# Patient Record
Sex: Male | Born: 1945 | Race: Asian | Hispanic: No | Marital: Married | Smoking: Former smoker
Health system: Southern US, Community
[De-identification: ages and names within clinical notes are randomized; demographics above are authoritative.]

## PROBLEM LIST (undated history)

## (undated) DIAGNOSIS — I10 Essential (primary) hypertension: Secondary | ICD-10-CM

## (undated) DIAGNOSIS — E039 Hypothyroidism, unspecified: Secondary | ICD-10-CM

## (undated) DIAGNOSIS — E7800 Pure hypercholesterolemia, unspecified: Secondary | ICD-10-CM

## (undated) DIAGNOSIS — I517 Cardiomegaly: Secondary | ICD-10-CM

## (undated) DIAGNOSIS — N400 Enlarged prostate without lower urinary tract symptoms: Secondary | ICD-10-CM

## (undated) HISTORY — DX: Essential (primary) hypertension: I10

## (undated) HISTORY — DX: Cardiomegaly: I51.7

---

## 2009-05-07 ENCOUNTER — Encounter: Payer: Self-pay | Admitting: *Deleted

## 2012-02-05 ENCOUNTER — Other Ambulatory Visit: Payer: Self-pay | Admitting: Specialist

## 2012-02-05 ENCOUNTER — Ambulatory Visit
Admission: RE | Admit: 2012-02-05 | Discharge: 2012-02-05 | Disposition: A | Discharge status: Home / Self Care | Payer: Medicare Other | Source: Ambulatory Visit | Attending: Specialist | Admitting: Specialist

## 2012-02-05 DIAGNOSIS — R062 Wheezing: Secondary | ICD-10-CM

## 2012-02-05 DIAGNOSIS — R05 Cough: Secondary | ICD-10-CM

## 2012-02-05 DIAGNOSIS — R059 Cough, unspecified: Secondary | ICD-10-CM

## 2012-03-02 ENCOUNTER — Encounter: Payer: Self-pay | Admitting: *Deleted

## 2012-03-02 DIAGNOSIS — I10 Essential (primary) hypertension: Secondary | ICD-10-CM | POA: Insufficient documentation

## 2012-03-02 DIAGNOSIS — I517 Cardiomegaly: Secondary | ICD-10-CM | POA: Insufficient documentation

## 2014-11-22 ENCOUNTER — Other Ambulatory Visit: Payer: Self-pay | Admitting: Internal Medicine

## 2014-11-22 DIAGNOSIS — R1084 Generalized abdominal pain: Secondary | ICD-10-CM

## 2014-12-04 ENCOUNTER — Ambulatory Visit
Admission: RE | Admit: 2014-12-04 | Discharge: 2014-12-04 | Disposition: A | Discharge status: Home / Self Care | Payer: Medicare Other | Source: Ambulatory Visit | Attending: Internal Medicine | Admitting: Internal Medicine

## 2014-12-04 ENCOUNTER — Encounter (INDEPENDENT_AMBULATORY_CARE_PROVIDER_SITE_OTHER): Payer: Self-pay

## 2014-12-04 DIAGNOSIS — R1084 Generalized abdominal pain: Secondary | ICD-10-CM

## 2015-08-30 ENCOUNTER — Other Ambulatory Visit: Payer: Self-pay | Admitting: Gastroenterology

## 2015-09-03 ENCOUNTER — Other Ambulatory Visit: Payer: Self-pay | Admitting: Internal Medicine

## 2015-09-03 DIAGNOSIS — R911 Solitary pulmonary nodule: Secondary | ICD-10-CM

## 2015-09-05 ENCOUNTER — Ambulatory Visit
Admission: RE | Admit: 2015-09-05 | Discharge: 2015-09-05 | Disposition: A | Discharge status: Home / Self Care | Payer: Medicare Other | Source: Ambulatory Visit | Attending: Internal Medicine | Admitting: Internal Medicine

## 2015-09-05 DIAGNOSIS — R911 Solitary pulmonary nodule: Secondary | ICD-10-CM

## 2015-09-16 ENCOUNTER — Encounter: Payer: Self-pay | Admitting: General Surgery

## 2015-09-16 NOTE — Progress Notes (Signed)
Alexander Murphy 09/16/2015 10:20 AM Location: [facility] Patient #: [id number] DOB: 07-Nov-1946 Widowed / Language: [place] / Race: Refused to Report/Unreported Male History of Present Illness Alexander Hollingshead MD; 09/16/2015 10:53 AM) The patient is a 69 year old male    Note:He is referred by Dr. Karsten Ro because of a right inguinal hernia. He is [place] and his brother-in-law, Alexander Murphy, is with him. 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 He's had a painful bulge for 2-3 months. He saw Dr. Karsten Ro because of some urinary difficulty due to BPH has been placed on medicine for this. CT scan demonstrates a right inguinal hernia containing a small amount of bladder and nonobstructed small bowel. He comes here to discuss treatment options with his brother-in-law.  Other Problems Davy Pique Bynum, CMA; 09/16/2015 10:20 AM) Back Pain Enlarged Prostate Gastroesophageal Reflux Disease High blood pressure Hypercholesterolemia Thyroid Cancer Ventral Hernia Repair  Past Surgical History Davy Pique Bynum, CMA; 09/16/2015 10:20 AM) No pertinent past surgical history  Diagnostic Studies History Marjean Donna, CMA; 09/16/2015 10:20 AM) Colonoscopy 1-5 years ago  Allergies Davy Pique Bynum, CMA; 09/16/2015 10:20 AM) No Known Drug Allergies 09/16/2015  Medication History (Sonya Bynum, CMA; 09/16/2015 10:21 AM) Levothyroxine Sodium (50MCG Tablet, Oral) Active. Omeprazole (20MG  Capsule DR, Oral) Active. Pravastatin Sodium (20MG  Tablet, Oral) Active. Tamsulosin HCl (0.4MG  Capsule, Oral) Active. DocQLace (100MG  Capsule, Oral) Active.  Social History Marjean Donna, [place]; 09/16/2015 10:20 AM) Alcohol use Occasional alcohol use. Caffeine use Coffee. No drug use Tobacco use Current every day smoker.  Family History Marjean Donna, [place]iqu[place]; 09/16/2015 10:20 AM) Family history unknown First Degree Relatives     Review  of Systems Davy Pique Bynum CMA; 09/16/2015 10:20 AM) General Not Present- Appetite Loss, Chills, Fatigue, Fever, Night Sweats, Weight Gain and Weight Loss. Skin Not Present- Change in Wart/Mole, Dryness, Hives, Jaundice, New Lesions, Non-Healing Wounds, Rash and Ulcer. HEENT Not Present- Earache, Hearing Loss, Hoarseness, Nose Bleed, Oral Ulcers, Ringing in the Ears, Seasonal Allergies, Sinus Pain, Sore Throat, Visual Disturbances, Wears glasses/contact lenses and Yellow Eyes. Respiratory Present- Snoring. Not Present- Bloody sputum, Chronic Cough, Difficulty Breathing and Wheezing. Breast Not Present- Breast Mass, Breast Pain, Nipple Discharge and Skin Changes. Cardiovascular Not Present- Chest Pain, Difficulty Breathing Lying Down, Leg Cramps, Palpitations, Rapid Heart Rate, Shortness of Breath and Swelling of Extremities. Gastrointestinal Not Present- Abdominal Pain, Bloating, Bloody Stool, Change in Bowel Habits, Chronic diarrhea, Constipation, Difficulty Swallowing, Excessive gas, Gets full quickly at meals, Hemorrhoids, Indigestion, Nausea, Rectal Pain and Vomiting. Male Genitourinary Present- Change in Urinary Stream, Impotence, Urgency and Urine Leakage. Not Present- Blood in Urine, Frequency, Nocturia and Painful Urination. Musculoskeletal Present- Back Pain. Not Present- Joint Pain, Joint Stiffness, Muscle Pain, Muscle Weakness and Swelling of Extremities. Neurological Not Present- Decreased Memory, Fainting, Headaches, Numbness, Seizures, Tingling, Tremor, Trouble walking and Weakness. Psychiatric Not Present- Anxiety, Bipolar, Change in Sleep Pattern, Depression, Fearful and Frequent crying. Endocrine Present- Hair Changes. Not Present- Cold Intolerance, Excessive Hunger, Heat Intolerance, Hot flashes and New Diabetes. Hematology Not Present- Easy Bruising, Excessive bleeding, Gland problems, HIV and Persistent Infections.  Vitals (Sonya Bynum CMA; 09/16/2015 10:21 AM) 09/16/2015 10:21  AM Weight: 165 lb Height: 62in Body Surface Area: 1.81 m Body Mass Index: 30.18 kg/m Temp.: 55F(Temporal)  Pulse: 77 (Regular)  BP: 132/80 (Sitting, Left Arm, Standard)     Physical Exam Alexander Hollingshead MD; 09/16/2015 10:55 AM)  The physical exam findings are as follows: Note:General: Overweight male in  NAD. Pleasant and cooperative.  HEENT: [place]/AT, no facial masses  CV: RRR, no murmur, no JVD.  CHEST: Breath sounds equal and clear. Respirations nonlabored.  ABDOMEN: Soft, nontender, reducible inguinal bulge with a small fascial defect palpable consistent with umbilical hernia  GU: Reducible right inguinal bulge. No left inguinal bulge.  MUSCULOSKELETAL: FROM, good muscle tone, no edema  SKIN: No jaundice or suspicious rashes.  NEUROLOGIC: Alert and oriented, normal gait and station.    Assessment & Plan Alexander Hollingshead MD; 09/16/2015 10:57 AM)  RIGHT INGUINAL HERNIA (K40.90) Impression: This is symptomatic and contains a small amount of urinary bladder and small intestine. We discussed options including expectant management versus repair. Given that it symptomatic, I recommended open repair with mesh and he and his brother-in-law would like to proceed with this. He has a colonoscopy planned for the first week in November so I suggested it be done after that.  Plan: Open right inguinal hernia repair with mesh as outpatient. I have explained the procedure, risks, and aftercare of inguinal hernia repair. Risks include but are not limited to bleeding, infection, wound problems, anesthesia, recurrence, bladder or intestine injury, urinary retention, testicular dysfunction, chronic pain, mesh problems. They seem to understand and agree with the plan.  Jackolyn Confer, MD

## 2015-10-29 ENCOUNTER — Encounter (HOSPITAL_COMMUNITY)
Admission: RE | Disposition: A | Discharge status: Home / Self Care | Payer: Self-pay | Source: Ambulatory Visit | Attending: Gastroenterology

## 2015-10-29 ENCOUNTER — Ambulatory Visit (HOSPITAL_COMMUNITY)
Admission: RE | Admit: 2015-10-29 | Discharge: 2015-10-29 | Disposition: A | Discharge status: Home / Self Care | Payer: Medicare Other | Source: Ambulatory Visit | Attending: Gastroenterology | Admitting: Gastroenterology

## 2015-10-29 ENCOUNTER — Ambulatory Visit (HOSPITAL_COMMUNITY): Payer: Medicare Other | Admitting: Anesthesiology

## 2015-10-29 ENCOUNTER — Encounter (HOSPITAL_COMMUNITY): Payer: Self-pay | Admitting: *Deleted

## 2015-10-29 DIAGNOSIS — K589 Irritable bowel syndrome without diarrhea: Secondary | ICD-10-CM | POA: Diagnosis not present

## 2015-10-29 DIAGNOSIS — I10 Essential (primary) hypertension: Secondary | ICD-10-CM | POA: Insufficient documentation

## 2015-10-29 DIAGNOSIS — E7800 Pure hypercholesterolemia, unspecified: Secondary | ICD-10-CM | POA: Insufficient documentation

## 2015-10-29 DIAGNOSIS — Z86010 Personal history of colonic polyps: Secondary | ICD-10-CM | POA: Diagnosis not present

## 2015-10-29 DIAGNOSIS — K635 Polyp of colon: Secondary | ICD-10-CM | POA: Diagnosis not present

## 2015-10-29 DIAGNOSIS — K219 Gastro-esophageal reflux disease without esophagitis: Secondary | ICD-10-CM | POA: Insufficient documentation

## 2015-10-29 DIAGNOSIS — Z87891 Personal history of nicotine dependence: Secondary | ICD-10-CM | POA: Insufficient documentation

## 2015-10-29 DIAGNOSIS — E039 Hypothyroidism, unspecified: Secondary | ICD-10-CM | POA: Diagnosis not present

## 2015-10-29 DIAGNOSIS — D125 Benign neoplasm of sigmoid colon: Secondary | ICD-10-CM | POA: Insufficient documentation

## 2015-10-29 DIAGNOSIS — D122 Benign neoplasm of ascending colon: Secondary | ICD-10-CM | POA: Diagnosis not present

## 2015-10-29 DIAGNOSIS — Z1211 Encounter for screening for malignant neoplasm of colon: Secondary | ICD-10-CM | POA: Diagnosis present

## 2015-10-29 HISTORY — PX: COLONOSCOPY WITH PROPOFOL: SHX5780

## 2015-10-29 SURGERY — COLONOSCOPY WITH PROPOFOL
Anesthesia: General

## 2015-10-29 MED ORDER — LIDOCAINE HCL (CARDIAC) 20 MG/ML IV SOLN
INTRAVENOUS | Status: AC
  Filled 2015-10-29: qty 5

## 2015-10-29 MED ORDER — PROPOFOL 500 MG/50ML IV EMUL
INTRAVENOUS | Status: DC | PRN
  Administered 2015-10-29: 140 ug/kg/min via INTRAVENOUS

## 2015-10-29 MED ORDER — PROPOFOL 10 MG/ML IV BOLUS
INTRAVENOUS | Status: DC | PRN
  Administered 2015-10-29 (×2): 20 mg via INTRAVENOUS

## 2015-10-29 MED ORDER — FENTANYL CITRATE (PF) 100 MCG/2ML IJ SOLN
INTRAMUSCULAR | Status: AC
  Filled 2015-10-29: qty 4

## 2015-10-29 MED ORDER — LACTATED RINGERS IV SOLN
INTRAVENOUS | Status: DC
  Administered 2015-10-29: 1000 mL via INTRAVENOUS

## 2015-10-29 MED ORDER — PROPOFOL 10 MG/ML IV BOLUS
INTRAVENOUS | Status: AC
  Filled 2015-10-29: qty 20

## 2015-10-29 MED ORDER — SODIUM CHLORIDE 0.9 % IV SOLN
INTRAVENOUS | Status: DC
Start: 2015-10-29 — End: 2015-10-29

## 2015-10-29 SURGICAL SUPPLY — 21 items

## 2015-10-29 NOTE — Anesthesia Postprocedure Evaluation (Signed)
Anesthesia Post Note  Patient: Alexander Murphy  Procedure(s) Performed: Procedure(s) (LRB): COLONOSCOPY WITH PROPOFOL (N/A)  Anesthesia type: MAC  Patient location: PACU  Post pain: Pain level controlled  Post assessment: Patient's Cardiovascular Status Stable  Last Vitals:  Filed Vitals:   10/29/15 1025  BP:   Pulse: 66  Temp:   Resp: 20    Post vital signs: Reviewed and stable  Level of consciousness: sedated  Complications: No apparent anesthesia complications

## 2015-10-29 NOTE — Op Note (Signed)
Procedure: Surveillance colonoscopy. Small adenomatous colon polyps removed colonoscopically in December 2013  Endoscopist: Earle Gell  Premedication: Propofol administered by anesthesia  Procedure: The patient was placed in the left lateral decubitus position. Anal inspection and digital rectal exam were normal. The Pentax pediatric colonoscope was introduced into the rectum and advanced to the cecum. A normal-appearing appendiceal orifice and ileocecal valve were identified. Colonic preparation for the exam today was good. Withdrawal time was 22 minutes  Rectum. From the mid rectum, a 3 mm sessile polyp was removed with the cold snare. Retroflexed view of the distal rectum was normal  Sigmoid colon. A 5 mm sessile polyp was removed with the hot snare and a 3 mm sessile polyp was removed with the cold snare  Descending colon. Two 3 mm sessile polyps were removed with the cold snare  Splenic flexure. Normal  Transverse colon. Normal  Hepatic flexure. Normal  Ascending colon. A 5 mm sessile polyp was removed with the cold snare  Cecum and ileocecal valve. Normal  Assessment: A diminutive polyp was removed from the rectum, a small polyp was removed from the sigmoid colon, a diminutive polyp was removed from the sigmoid colon, two diminutive polyps were removed from the descending colon, and a small polyp was removed from the ascending colon.

## 2015-10-29 NOTE — H&P (Signed)
  Procedure: Surveillance colonoscopy. 12/01/2012 colonoscopy performed with removal of three small adenomatous colon polyps  History: The patient is a 69 year old male born [birth year]. He is scheduled to undergo a surveillance colonoscopy today.  Past medical history: Hypothyroidism. Hypercholesterolemia. Gastroesophageal reflux. Irritable bowel syndrome. Fatty appearing liver by abdominal ultrasound in December 2015.  Exam: The patient is alert and lying comfortably on the endoscopy stretcher. Abdomen is soft and nontender to palpation. Cardiac exam reveals a regular rhythm. Lungs are clear to auscultation.  Plan: Proceed with surveillance colonoscopy

## 2015-10-29 NOTE — Transfer of Care (Signed)
Immediate Anesthesia Transfer of Care Note  Patient: Alexander Murphy  Procedure(s) Performed: Procedure(s): COLONOSCOPY WITH PROPOFOL (N/A)  Patient Location: Endoscopy Unit  Anesthesia Type:MAC  Level of Consciousness: awake  Airway & Oxygen Therapy: Patient Spontanous Breathing  Post-op Assessment: Report given to RN and Post -op Vital signs reviewed and stable  Post vital signs: Reviewed and stable  Last Vitals:  Filed Vitals:   10/29/15 0823  BP: 164/84  Temp: 36.6 C  Resp: 20    Complications: No apparent anesthesia complications

## 2015-10-29 NOTE — Anesthesia Preprocedure Evaluation (Signed)
Anesthesia Evaluation  Patient identified by MRN, date of birth, ID band Patient awake    Reviewed: Allergy & Precautions, NPO status , Patient's Chart, lab work & pertinent test results  Airway Mallampati: I  TM Distance: >3 FB Neck ROM: Full    Dental   Pulmonary former smoker,    Pulmonary exam normal        Cardiovascular hypertension, Pt. on medications Normal cardiovascular exam     Neuro/Psych    GI/Hepatic   Endo/Other    Renal/GU      Musculoskeletal   Abdominal   Peds  Hematology   Anesthesia Other Findings   Reproductive/Obstetrics                             Anesthesia Physical Anesthesia Plan  ASA: II  Anesthesia Plan: General   Post-op Pain Management:    Induction: Intravenous  Airway Management Planned: Natural Airway  Additional Equipment:   Intra-op Plan:   Post-operative Plan:   Informed Consent: I have reviewed the patients History and Physical, chart, labs and discussed the procedure including the risks, benefits and alternatives for the proposed anesthesia with the patient or authorized representative who has indicated his/her understanding and acceptance.     Plan Discussed with: CRNA and Surgeon  Anesthesia Plan Comments:         Anesthesia Quick Evaluation

## 2015-10-29 NOTE — Discharge Instructions (Signed)
Colonoscopy, Care After °Refer to this sheet in the next few weeks. These instructions provide you with information on caring for yourself after your procedure. Your health care provider may also give you more specific instructions. Your treatment has been planned according to current medical practices, but problems sometimes occur. Call your health care provider if you have any problems or questions after your procedure. °WHAT TO EXPECT AFTER THE PROCEDURE  °After your procedure, it is typical to have the following: °· A small amount of blood in your stool. °· Moderate amounts of gas and mild abdominal cramping or bloating. °HOME CARE INSTRUCTIONS °· Do not drive, operate machinery, or sign important documents for 24 hours. °· You may shower and resume your regular physical activities, but move at a slower pace for the first 24 hours. °· Take frequent rest periods for the first 24 hours. °· Walk around or put a warm pack on your abdomen to help reduce abdominal cramping and bloating. °· Drink enough fluids to keep your urine clear or pale yellow. °· You may resume your normal diet as instructed by your health care provider. Avoid heavy or fried foods that are hard to digest. °· Avoid drinking alcohol for 24 hours or as instructed by your health care provider. °· Only take over-the-counter or prescription medicines as directed by your health care provider. °· If a tissue sample (biopsy) was taken during your procedure: °¨ Do not take aspirin or blood thinners for 7 days, or as instructed by your health care provider. °¨ Do not drink alcohol for 7 days, or as instructed by your health care provider. °¨ Eat soft foods for the first 24 hours. °SEEK MEDICAL CARE IF: °You have persistent spotting of blood in your stool 2-3 days after the procedure. °SEEK IMMEDIATE MEDICAL CARE IF: °· You have more than a small spotting of blood in your stool. °· You pass large blood clots in your stool. °· Your abdomen is swollen  (distended). °· You have nausea or vomiting. °· You have a fever. °· You have increasing abdominal pain that is not relieved with medicine. °  °This information is not intended to replace advice given to you by your health care provider. Make sure you discuss any questions you have with your health care provider. °  °Document Released: 07/21/2004 Document Revised: 09/27/2013 Document Reviewed: 08/14/2013 °Elsevier Interactive Patient Education ©2016 [employer] ° °

## 2015-10-30 ENCOUNTER — Encounter (HOSPITAL_COMMUNITY): Payer: Self-pay | Admitting: Gastroenterology

## 2015-11-06 ENCOUNTER — Encounter (HOSPITAL_BASED_OUTPATIENT_CLINIC_OR_DEPARTMENT_OTHER): Payer: Self-pay | Admitting: *Deleted

## 2015-11-07 ENCOUNTER — Encounter (HOSPITAL_BASED_OUTPATIENT_CLINIC_OR_DEPARTMENT_OTHER)
Admission: RE | Admit: 2015-11-07 | Discharge: 2015-11-07 | Disposition: A | Discharge status: Home / Self Care | Payer: Medicare Other | Source: Ambulatory Visit | Attending: General Surgery | Admitting: General Surgery

## 2015-11-07 DIAGNOSIS — Z79899 Other long term (current) drug therapy: Secondary | ICD-10-CM | POA: Diagnosis not present

## 2015-11-07 DIAGNOSIS — K219 Gastro-esophageal reflux disease without esophagitis: Secondary | ICD-10-CM | POA: Diagnosis not present

## 2015-11-07 DIAGNOSIS — N401 Enlarged prostate with lower urinary tract symptoms: Secondary | ICD-10-CM | POA: Diagnosis not present

## 2015-11-07 DIAGNOSIS — Z85850 Personal history of malignant neoplasm of thyroid: Secondary | ICD-10-CM | POA: Diagnosis not present

## 2015-11-07 DIAGNOSIS — E7800 Pure hypercholesterolemia, unspecified: Secondary | ICD-10-CM | POA: Diagnosis not present

## 2015-11-07 DIAGNOSIS — I10 Essential (primary) hypertension: Secondary | ICD-10-CM | POA: Diagnosis not present

## 2015-11-07 DIAGNOSIS — K4090 Unilateral inguinal hernia, without obstruction or gangrene, not specified as recurrent: Secondary | ICD-10-CM | POA: Diagnosis not present

## 2015-11-07 DIAGNOSIS — F17200 Nicotine dependence, unspecified, uncomplicated: Secondary | ICD-10-CM | POA: Diagnosis not present

## 2015-11-07 LAB — COMPREHENSIVE METABOLIC PANEL
ALBUMIN: 3.9 g/dL (ref 3.5–5.0)
ALK PHOS: 78 U/L (ref 38–126)
ALT: 25 U/L (ref 17–63)
AST: 27 U/L (ref 15–41)
Anion gap: 7 (ref 5–15)
BUN: 12 mg/dL (ref 6–20)
CALCIUM: 9.4 mg/dL (ref 8.9–10.3)
CO2: 25 mmol/L (ref 22–32)
Chloride: 105 mmol/L (ref 101–111)
Creatinine, Ser: 1.12 mg/dL (ref 0.61–1.24)
GFR calc Af Amer: >60 mL/min (ref >60)
GFR calc non Af Amer: >60 mL/min (ref >60)
GLUCOSE: 91 mg/dL (ref 65–99)
Potassium: 5.9 mmol/L — ABNORMAL HIGH (ref 3.5–5.1)
Sodium: 137 mmol/L (ref 135–145)
TOTAL PROTEIN: 7.5 g/dL (ref 6.5–8.1)
Total Bilirubin: 0.5 mg/dL (ref 0.3–1.2)

## 2015-11-07 LAB — CBC WITH DIFFERENTIAL/PLATELET
BASOS ABS: 0.1 10*3/uL (ref 0.0–0.1)
BASOS PCT: 1 %
Eosinophils Absolute: 0.3 10*3/uL (ref 0.0–0.7)
Eosinophils Relative: 3 %
HEMATOCRIT: 48.2 % (ref 39.0–52.0)
HEMOGLOBIN: 15.9 g/dL (ref 13.0–17.0)
Lymphocytes Relative: 30 %
Lymphs Abs: 2.8 10*3/uL (ref 0.7–4.0)
MCH: 25.1 pg — ABNORMAL LOW (ref 26.0–34.0)
MCHC: 33 g/dL (ref 30.0–36.0)
MCV: 76 fL — ABNORMAL LOW (ref 78.0–100.0)
MONOS PCT: 7 %
Monocytes Absolute: 0.6 10*3/uL (ref 0.1–1.0)
NEUTROS ABS: 5.6 10*3/uL (ref 1.7–7.7)
NEUTROS PCT: 59 %
Platelets: 188 10*3/uL (ref 150–400)
RBC: 6.34 MIL/uL — AB (ref 4.22–5.81)
RDW: 15.1 % (ref 11.5–15.5)
WBC: 9.4 10*3/uL (ref 4.0–10.5)

## 2015-11-07 LAB — PROTIME-INR
INR: 1 (ref 0.00–1.49)
Prothrombin Time: 13.4 seconds (ref 11.6–15.2)

## 2015-11-07 NOTE — Progress Notes (Signed)
Dr Jillyn Hidden reviewed lab, K+ 5.9.   Wants Dr Zella Richer to work up prior to being put to sleep.  Dr Zella Richer would like repeat in am of Bmet.  Then decision will be made on result in am

## 2015-11-07 NOTE — Progress Notes (Signed)
Contacted friend will bring pt in for repeat lab in am

## 2015-11-08 ENCOUNTER — Encounter (HOSPITAL_BASED_OUTPATIENT_CLINIC_OR_DEPARTMENT_OTHER)
Admission: RE | Admit: 2015-11-08 | Discharge: 2015-11-08 | Disposition: A | Discharge status: Home / Self Care | Payer: Medicare Other | Source: Ambulatory Visit | Attending: General Surgery | Admitting: General Surgery

## 2015-11-08 DIAGNOSIS — K4090 Unilateral inguinal hernia, without obstruction or gangrene, not specified as recurrent: Secondary | ICD-10-CM | POA: Diagnosis not present

## 2015-11-08 LAB — BASIC METABOLIC PANEL
ANION GAP: 8 (ref 5–15)
BUN: 11 mg/dL (ref 6–20)
CALCIUM: 9.7 mg/dL (ref 8.9–10.3)
CO2: 26 mmol/L (ref 22–32)
Chloride: 103 mmol/L (ref 101–111)
Creatinine, Ser: 1.14 mg/dL (ref 0.61–1.24)
Glucose, Bld: 90 mg/dL (ref 65–99)
Potassium: 4.2 mmol/L (ref 3.5–5.1)
Sodium: 137 mmol/L (ref 135–145)

## 2015-11-10 NOTE — Anesthesia Preprocedure Evaluation (Addendum)
Anesthesia Evaluation  Patient identified by MRN, date of birth, ID band Patient awake    Reviewed: Allergy & Precautions, NPO status , Patient's Chart, lab work & pertinent test results  Airway Mallampati: I  TM Distance: >3 FB Neck ROM: Full    Dental   Pulmonary neg pulmonary ROS, former smoker,    Pulmonary exam normal        Cardiovascular hypertension, Pt. on medications Normal cardiovascular exam     Neuro/Psych negative neurological ROS  negative psych ROS   GI/Hepatic negative GI ROS, Neg liver ROS,   Endo/Other  negative endocrine ROSHypothyroidism   Renal/GU negative Renal ROS     Musculoskeletal negative musculoskeletal ROS (+)   Abdominal   Peds  Hematology negative hematology ROS (+)   Anesthesia Other Findings   Reproductive/Obstetrics                           Anesthesia Physical  Anesthesia Plan  ASA: II  Anesthesia Plan: General and Regional   Post-op Pain Management: MAC Combined w/ Regional for Post-op pain   Induction: Intravenous  Airway Management Planned: LMA  Additional Equipment:   Intra-op Plan:   Post-operative Plan: Extubation in OR  Informed Consent: I have reviewed the patients History and Physical, chart, labs and discussed the procedure including the risks, benefits and alternatives for the proposed anesthesia with the patient or authorized representative who has indicated his/her understanding and acceptance.   Dental advisory given  Plan Discussed with: CRNA  Anesthesia Plan Comments:         Anesthesia Quick Evaluation

## 2015-11-11 ENCOUNTER — Encounter (HOSPITAL_BASED_OUTPATIENT_CLINIC_OR_DEPARTMENT_OTHER)
Admission: RE | Disposition: A | Discharge status: Home / Self Care | Payer: Self-pay | Source: Ambulatory Visit | Attending: General Surgery

## 2015-11-11 ENCOUNTER — Ambulatory Visit (HOSPITAL_BASED_OUTPATIENT_CLINIC_OR_DEPARTMENT_OTHER)
Admission: RE | Admit: 2015-11-11 | Discharge: 2015-11-11 | Disposition: A | Discharge status: Home / Self Care | Payer: Medicare Other | Source: Ambulatory Visit | Attending: General Surgery | Admitting: General Surgery

## 2015-11-11 ENCOUNTER — Ambulatory Visit (HOSPITAL_BASED_OUTPATIENT_CLINIC_OR_DEPARTMENT_OTHER): Payer: Medicare Other | Admitting: Anesthesiology

## 2015-11-11 ENCOUNTER — Encounter (HOSPITAL_BASED_OUTPATIENT_CLINIC_OR_DEPARTMENT_OTHER): Payer: Self-pay | Admitting: *Deleted

## 2015-11-11 DIAGNOSIS — Z79899 Other long term (current) drug therapy: Secondary | ICD-10-CM | POA: Insufficient documentation

## 2015-11-11 DIAGNOSIS — E7800 Pure hypercholesterolemia, unspecified: Secondary | ICD-10-CM | POA: Insufficient documentation

## 2015-11-11 DIAGNOSIS — N401 Enlarged prostate with lower urinary tract symptoms: Secondary | ICD-10-CM | POA: Insufficient documentation

## 2015-11-11 DIAGNOSIS — K219 Gastro-esophageal reflux disease without esophagitis: Secondary | ICD-10-CM | POA: Diagnosis not present

## 2015-11-11 DIAGNOSIS — Z85850 Personal history of malignant neoplasm of thyroid: Secondary | ICD-10-CM | POA: Insufficient documentation

## 2015-11-11 DIAGNOSIS — I10 Essential (primary) hypertension: Secondary | ICD-10-CM | POA: Insufficient documentation

## 2015-11-11 DIAGNOSIS — K4090 Unilateral inguinal hernia, without obstruction or gangrene, not specified as recurrent: Secondary | ICD-10-CM | POA: Insufficient documentation

## 2015-11-11 DIAGNOSIS — F17200 Nicotine dependence, unspecified, uncomplicated: Secondary | ICD-10-CM | POA: Insufficient documentation

## 2015-11-11 HISTORY — DX: Pure hypercholesterolemia, unspecified: E78.00

## 2015-11-11 HISTORY — DX: Hypothyroidism, unspecified: E03.9

## 2015-11-11 HISTORY — PX: INGUINAL HERNIA REPAIR: SHX194

## 2015-11-11 HISTORY — DX: Benign prostatic hyperplasia without lower urinary tract symptoms: N40.0

## 2015-11-11 SURGERY — REPAIR, HERNIA, INGUINAL, ADULT
Anesthesia: Regional | Site: Groin | Laterality: Right

## 2015-11-11 MED ORDER — OXYCODONE HCL 5 MG PO TABS
ORAL_TABLET | ORAL | Status: AC
  Filled 2015-11-11: qty 1

## 2015-11-11 MED ORDER — BUPIVACAINE HCL (PF) 0.5 % IJ SOLN
INTRAMUSCULAR | Status: AC
Start: 2015-11-11 — End: 2015-11-11
  Filled 2015-11-11: qty 30

## 2015-11-11 MED ORDER — ONDANSETRON HCL 4 MG/2ML IJ SOLN
INTRAMUSCULAR | Status: AC
  Filled 2015-11-11: qty 2

## 2015-11-11 MED ORDER — DEXAMETHASONE SODIUM PHOSPHATE 10 MG/ML IJ SOLN
INTRAMUSCULAR | Status: AC
  Filled 2015-11-11: qty 1

## 2015-11-11 MED ORDER — CEFAZOLIN SODIUM-DEXTROSE 2-3 GM-% IV SOLR
2.0000 g | INTRAVENOUS | Status: AC
  Administered 2015-11-11: 2 g via INTRAVENOUS

## 2015-11-11 MED ORDER — SUCCINYLCHOLINE CHLORIDE 20 MG/ML IJ SOLN
INTRAMUSCULAR | Status: AC
  Filled 2015-11-11: qty 1

## 2015-11-11 MED ORDER — OXYCODONE HCL 5 MG PO TABS: 5.0000 mg | ORAL_TABLET | Freq: Once | ORAL | Status: DC

## 2015-11-11 MED ORDER — FENTANYL CITRATE (PF) 100 MCG/2ML IJ SOLN
INTRAMUSCULAR | Status: DC | PRN
  Administered 2015-11-11: 50 ug via INTRAVENOUS

## 2015-11-11 MED ORDER — FENTANYL CITRATE (PF) 100 MCG/2ML IJ SOLN
INTRAMUSCULAR | Status: AC
  Filled 2015-11-11: qty 2

## 2015-11-11 MED ORDER — LIDOCAINE HCL (CARDIAC) 20 MG/ML IV SOLN
INTRAVENOUS | Status: AC
  Filled 2015-11-11: qty 5

## 2015-11-11 MED ORDER — OXYCODONE HCL 5 MG PO TABS: 5.0000 mg | ORAL_TABLET | ORAL | Status: AC | PRN

## 2015-11-11 MED ORDER — FENTANYL CITRATE (PF) 100 MCG/2ML IJ SOLN
50.0000 ug | INTRAMUSCULAR | Status: DC | PRN
  Administered 2015-11-11: 100 ug via INTRAVENOUS

## 2015-11-11 MED ORDER — BUPIVACAINE-EPINEPHRINE (PF) 0.5% -1:200000 IJ SOLN
INTRAMUSCULAR | Status: AC
  Filled 2015-11-11: qty 30

## 2015-11-11 MED ORDER — MEPERIDINE HCL 25 MG/ML IJ SOLN: 6.2500 mg | INTRAMUSCULAR | Status: DC | PRN

## 2015-11-11 MED ORDER — LACTATED RINGERS IV SOLN
INTRAVENOUS | Status: DC
  Administered 2015-11-11: 08:00:00 via INTRAVENOUS
  Administered 2015-11-11: 10 mL/h via INTRAVENOUS

## 2015-11-11 MED ORDER — ONDANSETRON HCL 4 MG/2ML IJ SOLN
INTRAMUSCULAR | Status: DC | PRN
  Administered 2015-11-11: 4 mg via INTRAVENOUS

## 2015-11-11 MED ORDER — DEXAMETHASONE SODIUM PHOSPHATE 4 MG/ML IJ SOLN
INTRAMUSCULAR | Status: DC | PRN
  Administered 2015-11-11: 10 mg via INTRAVENOUS

## 2015-11-11 MED ORDER — PROPOFOL 10 MG/ML IV BOLUS
INTRAVENOUS | Status: DC | PRN
  Administered 2015-11-11: 30 mg via INTRAVENOUS
  Administered 2015-11-11: 50 mg via INTRAVENOUS
  Administered 2015-11-11: 30 mg via INTRAVENOUS
  Administered 2015-11-11: 150 mg via INTRAVENOUS

## 2015-11-11 MED ORDER — HYDROMORPHONE HCL 1 MG/ML IJ SOLN
0.2500 mg | INTRAMUSCULAR | Status: DC | PRN
Start: 2015-11-11 — End: 2015-11-11

## 2015-11-11 MED ORDER — PROMETHAZINE HCL 25 MG/ML IJ SOLN: 6.2500 mg | INTRAMUSCULAR | Status: DC | PRN

## 2015-11-11 MED ORDER — MIDAZOLAM HCL 2 MG/2ML IJ SOLN
INTRAMUSCULAR | Status: AC
  Filled 2015-11-11: qty 2

## 2015-11-11 MED ORDER — MIDAZOLAM HCL 2 MG/2ML IJ SOLN
1.0000 mg | INTRAMUSCULAR | Status: DC | PRN
  Administered 2015-11-11: 2 mg via INTRAVENOUS

## 2015-11-11 MED ORDER — BUPIVACAINE-EPINEPHRINE (PF) 0.5% -1:200000 IJ SOLN
INTRAMUSCULAR | Status: DC | PRN
  Administered 2015-11-11: 24 mL

## 2015-11-11 MED ORDER — GLYCOPYRROLATE 0.2 MG/ML IJ SOLN: 0.2000 mg | Freq: Once | INTRAMUSCULAR | Status: DC | PRN

## 2015-11-11 MED ORDER — SCOPOLAMINE 1 MG/3DAYS TD PT72: 1.0000 | MEDICATED_PATCH | Freq: Once | TRANSDERMAL | Status: DC | PRN

## 2015-11-11 MED ORDER — CEFAZOLIN SODIUM-DEXTROSE 2-3 GM-% IV SOLR
INTRAVENOUS | Status: AC
Start: 2015-11-11 — End: 2015-11-11
  Filled 2015-11-11: qty 50

## 2015-11-11 MED ORDER — OXYCODONE HCL 5 MG/5ML PO SOLN: 5.0000 mg | Freq: Once | ORAL | Status: AC | PRN

## 2015-11-11 MED ORDER — OXYCODONE HCL 5 MG PO TABS
5.0000 mg | ORAL_TABLET | Freq: Once | ORAL | Status: AC | PRN
  Administered 2015-11-11: 5 mg via ORAL

## 2015-11-11 MED ORDER — BUPIVACAINE-EPINEPHRINE (PF) 0.5% -1:200000 IJ SOLN
INTRAMUSCULAR | Status: DC | PRN
  Administered 2015-11-11: 30 mL

## 2015-11-11 MED ORDER — PROPOFOL 10 MG/ML IV BOLUS
INTRAVENOUS | Status: AC
  Filled 2015-11-11: qty 20

## 2015-11-11 SURGICAL SUPPLY — 52 items
BENZOIN TINCTURE PRP APPL 2/3 (GAUZE/BANDAGES/DRESSINGS) ×3 IMPLANT
BLADE CLIPPER SURG (BLADE) ×3 IMPLANT
BLADE SURG 10 STRL SS (BLADE) IMPLANT
BLADE SURG 15 STRL LF DISP TIS (BLADE) ×1 IMPLANT
BLADE SURG 15 STRL SS (BLADE) ×2
CANISTER SUCT 1200ML W/VALVE (MISCELLANEOUS) ×3 IMPLANT
CHLORAPREP W/TINT 26ML (MISCELLANEOUS) ×3 IMPLANT
CLEANER CAUTERY TIP 5X5 PAD (MISCELLANEOUS) IMPLANT
CLOSURE WOUND 1/2 X4 (GAUZE/BANDAGES/DRESSINGS) ×1
COVER BACK TABLE 60X90IN (DRAPES) ×3 IMPLANT
COVER MAYO STAND STRL (DRAPES) ×3 IMPLANT
DECANTER SPIKE VIAL GLASS SM (MISCELLANEOUS) IMPLANT
DRAIN PENROSE 1/2X12 LTX STRL (WOUND CARE) ×3 IMPLANT
DRAPE INCISE IOBAN 66X45 STRL (DRAPES) ×3 IMPLANT
DRAPE LAPAROTOMY T 102X78X121 (DRAPES) ×3 IMPLANT
DRAPE UTILITY XL STRL (DRAPES) ×3 IMPLANT
DRSG TEGADERM 2-3/8X2-3/4 SM (GAUZE/BANDAGES/DRESSINGS) IMPLANT
DRSG TEGADERM 4X4.75 (GAUZE/BANDAGES/DRESSINGS) ×3 IMPLANT
ELECT REM PT RETURN 9FT ADLT (ELECTROSURGICAL) ×3
ELECTRODE REM PT RTRN 9FT ADLT (ELECTROSURGICAL) ×1 IMPLANT
GAUZE SPONGE 4X4 16PLY XRAY LF (GAUZE/BANDAGES/DRESSINGS) IMPLANT
GLOVE BIOGEL PI IND STRL 7.0 (GLOVE) ×2 IMPLANT
GLOVE BIOGEL PI IND STRL 8.5 (GLOVE) ×1 IMPLANT
GLOVE BIOGEL PI INDICATOR 7.0 (GLOVE) ×4
GLOVE BIOGEL PI INDICATOR 8.5 (GLOVE) ×2
GLOVE ECLIPSE 6.5 STRL STRAW (GLOVE) ×3 IMPLANT
GLOVE ECLIPSE 8.0 STRL XLNG CF (GLOVE) ×3 IMPLANT
GOWN STRL REUS W/ TWL LRG LVL3 (GOWN DISPOSABLE) ×2 IMPLANT
GOWN STRL REUS W/TWL LRG LVL3 (GOWN DISPOSABLE) ×4
MESH HERNIA 3X6 (Mesh General) ×3 IMPLANT
NEEDLE HYPO 25X1 1.5 SAFETY (NEEDLE) ×3 IMPLANT
NS IRRIG 1000ML POUR BTL (IV SOLUTION) ×3 IMPLANT
PACK BASIN DAY SURGERY FS (CUSTOM PROCEDURE TRAY) ×3 IMPLANT
PAD CLEANER CAUTERY TIP 5X5 (MISCELLANEOUS)
PENCIL BUTTON HOLSTER BLD 10FT (ELECTRODE) ×3 IMPLANT
SLEEVE SCD COMPRESS KNEE MED (MISCELLANEOUS) ×3 IMPLANT
SPONGE GAUZE 4X4 12PLY STER LF (GAUZE/BANDAGES/DRESSINGS) ×3 IMPLANT
SPONGE INTESTINAL PEANUT (DISPOSABLE) ×3 IMPLANT
STRIP CLOSURE SKIN 1/2X4 (GAUZE/BANDAGES/DRESSINGS) ×2 IMPLANT
SUT MON AB 4-0 PC3 18 (SUTURE) ×3 IMPLANT
SUT PROLENE 2 0 CT2 30 (SUTURE) ×6 IMPLANT
SUT VIC AB 2-0 SH 18 (SUTURE) ×3 IMPLANT
SUT VIC AB 3-0 SH 27 (SUTURE) ×4
SUT VIC AB 3-0 SH 27X BRD (SUTURE) ×2 IMPLANT
SUT VICRYL AB 2 0 TIE (SUTURE) IMPLANT
SUT VICRYL AB 2 0 TIES (SUTURE)
SYR CONTROL 10ML LL (SYRINGE) ×3 IMPLANT
TOWEL OR 17X24 6PK STRL BLUE (TOWEL DISPOSABLE) ×3 IMPLANT
TOWEL OR NON WOVEN STRL DISP B (DISPOSABLE) IMPLANT
TUBE CONNECTING 20'X1/4 (TUBING) ×1
TUBE CONNECTING 20X1/4 (TUBING) ×2 IMPLANT
YANKAUER SUCT BULB TIP NO VENT (SUCTIONS) ×3 IMPLANT

## 2015-11-11 NOTE — Transfer of Care (Signed)
Immediate Anesthesia Transfer of Care Note  Patient: Alexander Murphy  Procedure(s) Performed: Procedure(s): RIGHT INGUINAL HERNIA REPAIR WITH MESH (Right)  Patient Location: PACU  Anesthesia Type:GA combined with regional for post-op pain  Level of Consciousness: sedated  Airway & Oxygen Therapy: Patient Spontanous Breathing and Patient connected to face mask oxygen  Post-op Assessment: Report given to RN and Post -op Vital signs reviewed and stable  Post vital signs: Reviewed and stable  Last Vitals:  Filed Vitals:   11/11/15 0720 11/11/15 0725  BP: 126/77   Pulse: 75 79  Temp:    Resp: 17 15    Complications: No apparent anesthesia complications

## 2015-11-11 NOTE — Discharge Instructions (Addendum)
[facility] _______[facility]   INGUINAL HERNIA REPAIR: POST OP INSTRUCTIONS  Always review your discharge instruction sheet given to you by the facility where your surgery was performed. IF YOU HAVE DISABILITY OR FAMILY LEAVE FORMS, YOU MUST BRING THEM TO THE OFFICE FOR PROCESSING.   DO NOT GIVE THEM TO YOUR DOCTOR.  1. A  prescription for pain medication may be given to you upon discharge.  Take your pain medication as prescribed, if needed.  If narcotic pain medicine is not needed, then you may take acetaminophen (Tylenol) or ibuprofen (Advil) as needed. 2. Take your usually prescribed medications unless otherwise directed. 3. If you need a refill on your pain medication, please contact your pharmacy.  They will contact our office to request authorization. Prescriptions will not be filled after 5 pm or on week-ends. 4. You should follow a light diet the first 24 hours after arrival home, such as soup and crackers, etc.  Be sure to include lots of fluids daily.  Resume your normal diet the day after surgery. 5. Most patients will experience some swelling and bruising in the groin and scrotum.  Ice packs and reclining will help.  Swelling and bruising can take several days to resolve.  6. It is common to experience some constipation if taking pain medication after surgery.  Increasing fluid intake and taking a stool softener (such as Colace) will usually help or prevent this problem from occurring.  A mild laxative (Milk of Magnesia or Miralax) should be taken according to package directions if there are no bowel movements after 48 hours. 7. Unless discharge instructions indicate otherwise, you may remove your bandages 3 days after surgery, and you may shower at that time.  You may have steri-strips (small skin tapes) in place directly over the incision.  These strips should be left on the skin until they fall off by themselves.  If your surgeon used skin glue on the incision, you may shower  in 24 hours.  The glue will flake off over the next 2-3 weeks.  Any sutures or staples will be removed at the office during your follow-up visit. 8. ACTIVITIES:  You may resume regular (light) daily activities beginning the next day--such as daily self-care, walking, climbing stairs--gradually increasing activities as tolerated.  You may have sexual intercourse when it is comfortable.  Refrain from any heavy lifting or straining for 6 weeks-nothing over 10 pounds. a. You may drive when you are no longer taking prescription pain medication, you can comfortably wear a seatbelt, and you can safely maneuver your car and apply brakes. b. RETURN TO WORK:  __________________________________________________________ 9. You should see your doctor in the office for a follow-up appointment approximately 2-3 weeks after your surgery.  Make sure that you call for this appointment within a day or two after you arrive home to insure a convenient appointment time. 10. OTHER INSTRUCTIONS:  __________________________________________________________________________________________________________________________________________________________________________________________  WHEN TO CALL YOUR DOCTOR: 1. Fever over 101.0 2. Inability to urinate 3. Nausea and/or vomiting 4. Extreme swelling or bruising 5. Continued bleeding from incision. 6. Increased pain, redness, or drainage from the incision  The clinic staff is available to answer your questions during regular business hours.  Please dont hesitate to call and ask to speak to one of the nurses for clinical concerns.  If you have a medical emergency, go to the nearest emergency room or call 911.  A surgeon from [facility] is always on call at the hospital   [address], [place], [place], [place]  [address] ?  [address], [place], [place]   [id number] [phone] ? [phone] ? FAX [phone] Web site:  [email or web address]  Post Anesthesia Home Care Instructions  Activity: Get plenty of rest for the remainder of the day. A responsible adult should stay with you for 24 hours following the procedure.  For the next 24 hours, DO NOT: -Drive a car -Paediatric nurse -Drink alcoholic beverages -Take any medication unless instructed by your physician -Make any legal decisions or sign important papers.  Meals: Start with liquid foods such as gelatin or soup. Progress to regular foods as tolerated. Avoid greasy, spicy, heavy foods. If nausea and/or vomiting occur, drink only clear liquids until the nausea and/or vomiting subsides. Call your physician if vomiting continues.  Special Instructions/Symptoms: Your throat may feel dry or sore from the anesthesia or the breathing tube placed in your throat during surgery. If this causes discomfort, gargle with warm salt water. The discomfort should disappear within 24 hours.  If you had a scopolamine patch placed behind your ear for the management of post- operative nausea and/or vomiting:  1. The medication in the patch is effective for 72 hours, after which it should be removed.  Wrap patch in a tissue and discard in the trash. Wash hands thoroughly with soap and water. 2. You may remove the patch earlier than 72 hours if you experience unpleasant side effects which may include dry mouth, dizziness or visual disturbances. 3. Avoid touching the patch. Wash your hands with soap and water after contact with the patch.

## 2015-11-11 NOTE — Op Note (Signed)
OPERATIVE NOTE- INGUINAL HERNIA  Preoperative diagnosis:  Right inguinal hernia.  Postoperative diagnosis:  Same (direct)  Procedure:  Right inguinal hernia repair with mesh.  Surgeon:  Jackolyn Confer, M.D.  Anesthesia:  General/LMA with local (Marcaine).  Indication:  This is a 69 year old male with a painful right inguinal bulge consistent with an inguinal hernia on exam and by CT.  He now presents for repair. He received a TAP block in the holding room.  Technique:  He was seen in the holding room and the right groin was marked with my initials. He was brought to the operating room, placed supine on the operating table, and the anesthetic was administered by the anesthesiologist. The hair in the groin area was clipped as was felt to be necessary. This area was then sterilely prepped and draped.  Local anesthetic was infiltrated in the superficial and deep tissues in the right groin.  An incision was made through the skin and subcutaneous tissue until the external oblique aponeurosis was identified.  Local anesthetic was infiltrated deep to the external oblique aponeurosis. The external oblique aponeurosis was divided through the external ring medially and back toward the anterior superior iliac spine laterally. Using blunt dissection, the shelving edge of the inguinal ligament was identified inferiorly and the internal oblique aponeurosis and muscle were identified superiorly. The ilioinguinal nerve was identified and preserved.  The spermatic cord was isolated and a posterior window was made around it. A large direct hernia sac was identified and separated from the spermatic cord using blunt dissection. The hernia sac and its contents were reduced through the direct hernia defect.   A piece of 3" x 6" polypropylene mesh was brought into the field and anchored 1-2 cm medial to the pubic tubercle with 2-0 Prolene suture. The inferior aspect of the mesh was anchored to the shelving edge of the  inguinal ligament with running 2-0 Prolene suture to a level 1-2 cm lateral to the internal ring. A slit was cut in the mesh creating 2 tails. These were wrapped around the spermatic cord. The superior aspect of the mesh was anchored to the internal oblique aponeurosis and muscle with interrupted 2-0 Vicryl sutures. The 2 tails of the mesh were then crossed creating a new internal ring and were anchored to the shelving edge of the inguinal ligament with 2-0 Prolene suture. The tip of a hemostat could be placed through the new aperture. The lateral aspect of the mesh was then tucked deep to the external oblique aponeurosis.  The wound was inspected and hemostasis was adequate. The external oblique aponeurosis was then closed over the mesh and cord with running 3-0 Vicryl suture. The subcutaneous tissue was closed with running 3-0 Vicryl suture. The skin closed with a running 4-0 Monocryl subcuticular stitch.  Steri-Strips and a sterile dressing were applied.  The procedure was well-tolerated without any apparent complications and he was taken to the recovery room in satisfactory condition.

## 2015-11-11 NOTE — Anesthesia Postprocedure Evaluation (Signed)
Anesthesia Post Note  Patient: Alexander Murphy  Procedure(s) Performed: Procedure(s) (LRB): RIGHT INGUINAL HERNIA REPAIR WITH MESH (Right)  Patient location during evaluation: PACU Anesthesia Type: General Level of consciousness: sedated and patient cooperative Pain management: pain level controlled Vital Signs Assessment: post-procedure vital signs reviewed and stable Respiratory status: spontaneous breathing Cardiovascular status: stable Anesthetic complications: no    Last Vitals:  Filed Vitals:   11/11/15 1015 11/11/15 1045  BP: 126/80 133/87  Pulse: 67 70  Temp:  36.6 C  Resp: 11 16    Last Pain:  Filed Vitals:   11/11/15 1049  PainSc: [place]

## 2015-11-11 NOTE — Progress Notes (Signed)
Assisted Dr. Germeroth with right, ultrasound guided, transabdominal plane block. Side rails up, monitors on throughout procedure. See vital signs in flow sheet. Tolerated Procedure well. 

## 2015-11-11 NOTE — Anesthesia Procedure Notes (Addendum)
Procedure Name: LMA Insertion Date/Time: 11/11/2015 7:39 AM Performed by: Toula Moos L Pre-anesthesia Checklist: Emergency Drugs available, Suction available, Patient being monitored, Timeout performed and Patient identified Patient Re-evaluated:Patient Re-evaluated prior to inductionOxygen Delivery Method: Circle System Utilized Preoxygenation: Pre-oxygenation with 100% oxygen Intubation Type: IV induction Ventilation: Mask ventilation without difficulty LMA: LMA inserted LMA Size: 5.0 Number of attempts: 1 Airway Equipment and Method: Bite block Placement Confirmation: positive ETCO2 Tube secured with: Tape Dental Injury: Teeth and Oropharynx as per pre-operative assessment     Anesthesia Regional Block:  TAP block  Pre-Anesthetic Checklist: ,, timeout performed, Correct Patient, Correct Site, Correct Laterality, Correct Procedure, Correct Position, site marked, Risks and benefits discussed, Surgical consent,  Pre-op evaluation,  Post-op pain management  Laterality: Right  Prep: chloraprep       Needles:   Needle Type: Stimiplex     Needle Length: 9cm 9 cm     Additional Needles:  Procedures: ultrasound guided (picture in chart) TAP block Narrative:  Injection made incrementally with aspirations every 5 mL.  Performed by: Personally  Anesthesiologist: Nolon Nations  Additional Notes: Patient tolerated well. Good fascial spread noted.

## 2015-11-11 NOTE — H&P (Signed)
He was referred by Dr. Karsten Ro because of a right inguinal hernia. He is [place] and his brother-in-law, Reather Converse, is with him. Reather Converse is bilingual and has been doing medical translation for [place] people in [place] for years. He's had a painful bulge for 2-3 months. He saw Dr. Karsten Ro because of some urinary difficulty due to BPH has been placed on medicine for this. CT scan demonstrates a right inguinal hernia containing a small amount of bladder and nonobstructed small bowel. He presents for elective hernia repair.  Other Problems  Back Pain Enlarged Prostate Gastroesophageal Reflux Disease High blood pressure Hypercholesterolemia Thyroid Cancer Ventral Hernia Repair  Past Surgical History No pertinent past surgical history  Allergies  No Known Drug Allergies    Social History  Alcohol use Occasional alcohol use. Caffeine use Coffee. No drug use Tobacco use Current every day smoker.  Family History  Family history unknown First Degree Relatives     Review of Systems  General Not Present- Appetite Loss, Chills, Fatigue, Fever, Night Sweats, Weight Gain and Weight Loss. Skin Not Present- Change in Wart/Mole, Dryness, Hives, Jaundice, New Lesions, Non-Healing Wounds, Rash and Ulcer. HEENT Not Present- Earache, Hearing Loss, Hoarseness, Nose Bleed, Oral Ulcers, Ringing in the Ears, Seasonal Allergies, Sinus Pain, Sore Throat, Visual Disturbances, Wears glasses/contact lenses and Yellow Eyes. Respiratory Present- Snoring. Not Present- Bloody sputum, Chronic Cough, Difficulty Breathing and Wheezing. Breast Not Present- Breast Mass, Breast Pain, Nipple Discharge and Skin Changes. Cardiovascular Not Present- Chest Pain, Difficulty Breathing Lying Down, Leg Cramps, Palpitations, Rapid Heart Rate, Shortness of Breath and Swelling of Extremities. Gastrointestinal Not Present- Abdominal Pain, Bloating, Bloody Stool, Change in Bowel Habits, Chronic diarrhea,  Constipation, Difficulty Swallowing, Excessive gas, Gets full quickly at meals, Hemorrhoids, Indigestion, Nausea, Rectal Pain and Vomiting. Male Genitourinary Present- Change in Urinary Stream, Impotence, Urgency and Urine Leakage. Not Present- Blood in Urine, Frequency, Nocturia and Painful Urination. Musculoskeletal Present- Back Pain. Not Present- Joint Pain, Joint Stiffness, Muscle Pain, Muscle Weakness and Swelling of Extremities. Neurological Not Present- Decreased Memory, Fainting, Headaches, Numbness, Seizures, Tingling, Tremor, Trouble walking and Weakness. Psychiatric Not Present- Anxiety, Bipolar, Change in Sleep Pattern, Depression, Fearful and Frequent crying. Endocrine Present- Hair Changes. Not Present- Cold Intolerance, Excessive Hunger, Heat Intolerance, Hot flashes and New Diabetes. Hematology Not Present- Easy Bruising, Excessive bleeding, Gland problems, HIV and Persistent Infections.    Physical Exam Note:General: Overweight male in NAD. Pleasant and cooperative.  HEENT: [place]/AT, no facial masses  CV: RRR, no murmur, no JVD.  CHEST: Breath sounds equal and clear. Respirations nonlabored.  ABDOMEN: Soft, nontender, reducible inguinal bulge with a small fascial defect palpable consistent with umbilical hernia  GU: Reducible right inguinal bulge. No left inguinal bulge.  MUSCULOSKELETAL: FROM, good muscle tone, no edema  SKIN: No jaundice or suspicious rashes.  NEUROLOGIC: Alert and oriented, normal gait and station.    Assessment & Plan  RIGHT INGUINAL HERNIA (K40.90) Impression: This is symptomatic and contains a small amount of urinary bladder and small intestine. We discussed options including expectant management versus repair. Given that it symptomatic, I recommended open repair with mesh and he and his brother-in-law would like to proceed with this.  Plan: Open right inguinal hernia repair with mesh as outpatient. I have explained the procedure, risks, and  aftercare of inguinal hernia repair. Risks include but are not limited to bleeding, infection, wound problems, anesthesia, recurrence, bladder or intestine injury, urinary retention, testicular dysfunction, chronic pain, mesh problems. They seem to understand and agree with  the plan.  Jackolyn Confer, MD

## 2015-11-11 NOTE — Interval H&P Note (Signed)
History and Physical Interval Note:  11/11/2015 7:30 AM  Alexander Murphy  has presented today for surgery, with the diagnosis of Right Inguinal Hernia  The various methods of treatment have been discussed with the patient and family. After consideration of risks, benefits and other options for treatment, the patient has consented to  Procedure(s): RIGHT INGUINAL HERNIA REPAIR WITH MESH (Right) as a surgical intervention .  The patient's history has been reviewed, patient examined, no change in status, stable for surgery.  I have reviewed the patient's chart and labs.  Questions were answered to the patient's satisfaction.     [name]

## 2015-11-12 ENCOUNTER — Encounter (HOSPITAL_BASED_OUTPATIENT_CLINIC_OR_DEPARTMENT_OTHER): Payer: Self-pay | Admitting: General Surgery

## 2016-07-30 IMAGING — CT CT CHEST W/O CM
2 of 3 series · 15 of 36 positions shown, 18 images · non-contrast
Comparison: 08/13/2015

CLINICAL DATA: Followup lung nodules seen on prior CT abdomen
pelvis performed 08/13/2015, patient smokes, no history of cancer

EXAM:
CT CHEST WITHOUT CONTRAST
TECHNIQUE: Multidetector CT imaging of the chest was performed following the
standard protocol without IV contrast.

[Series 4: chest w/(date) · axial · 0.74mm/px · z∈[-341,-81]mm · 12 of 62 slices shown, 15 images]
[im 5/62  mediastinal]
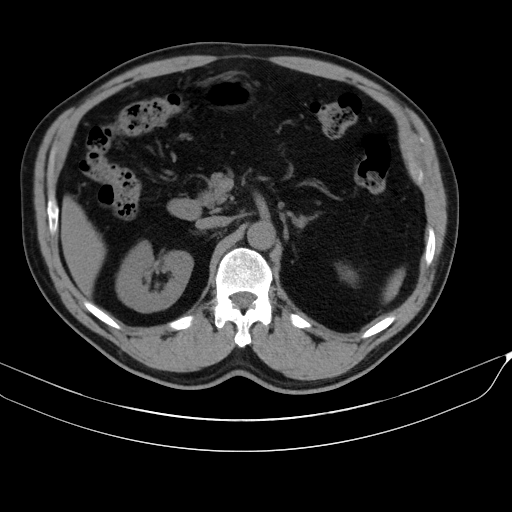
[im 5/62  lung]
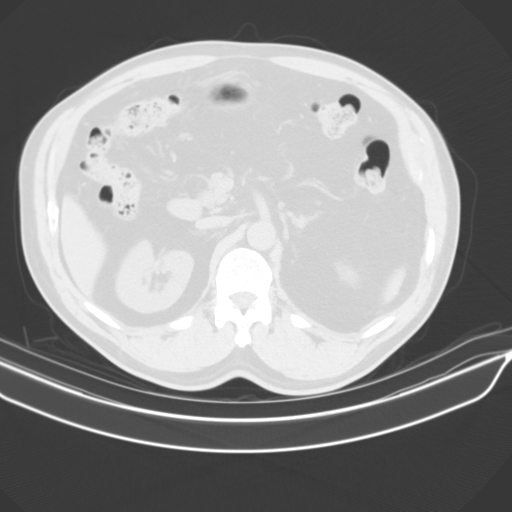
[im 10/62  lung]
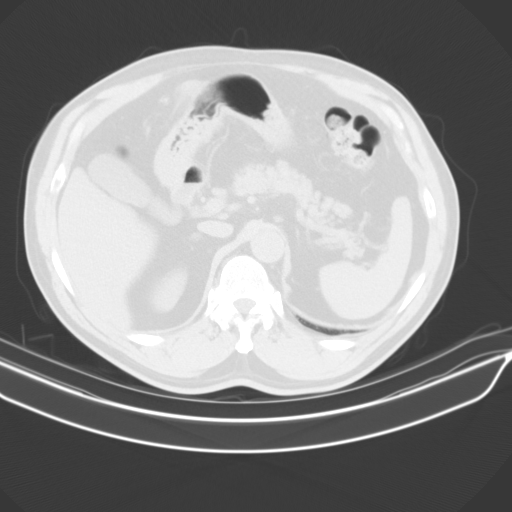
[im 14/62  lung]
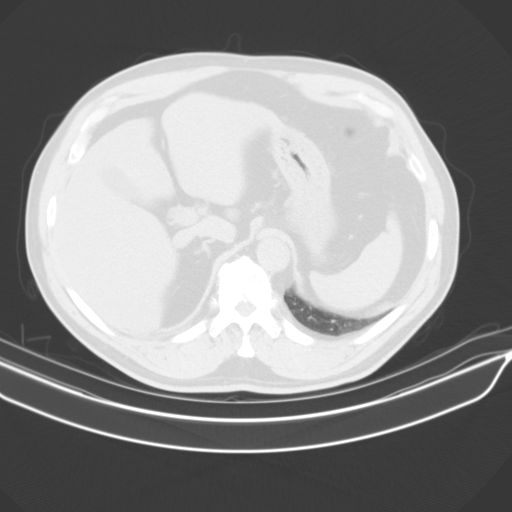
[im 19/62  lung]
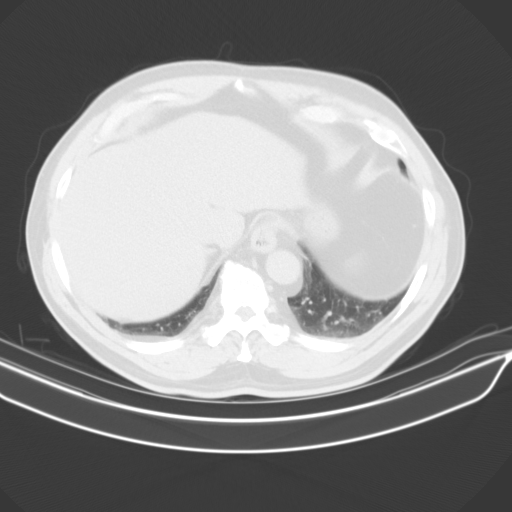
[im 23/62  mediastinal]
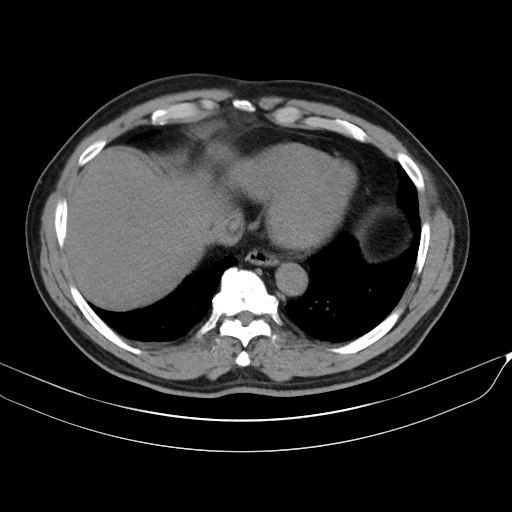
[im 23/62  lung]
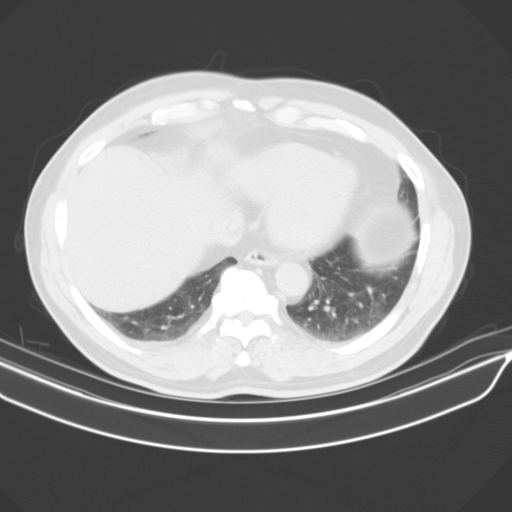
[im 28/62  lung]
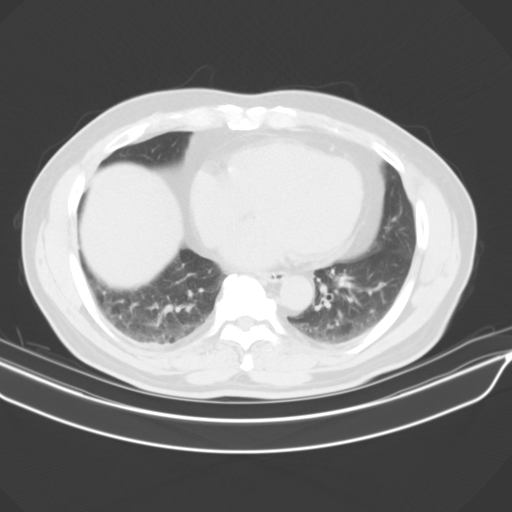
[im 34/62  lung]
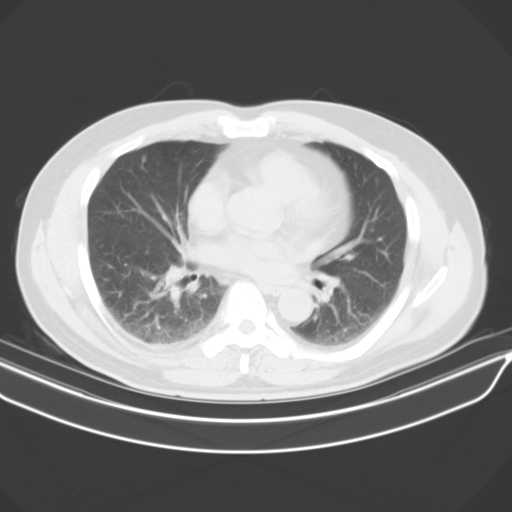
[im 39/62  lung]
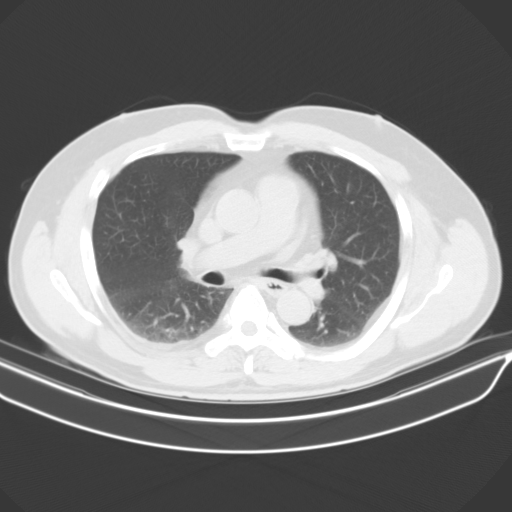
[im 43/62  mediastinal]
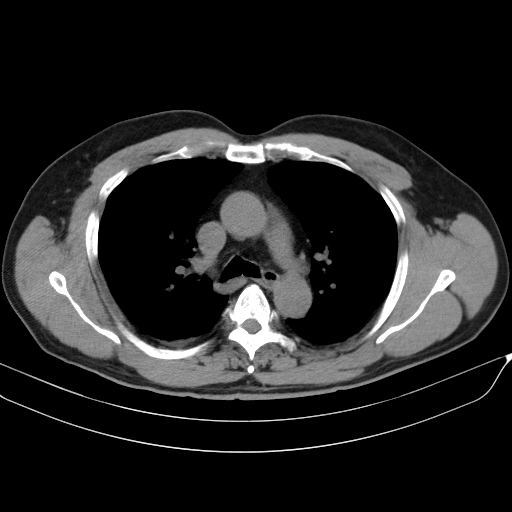
[im 43/62  lung]
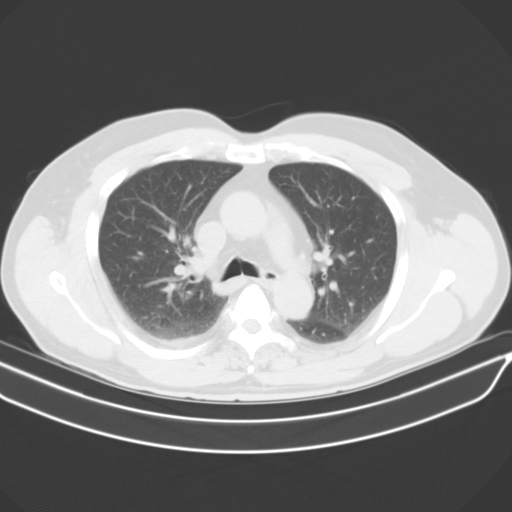
[im 48/62  lung]
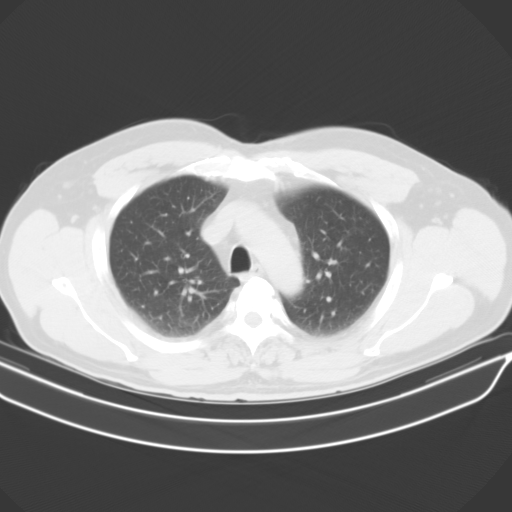
[im 52/62  lung]
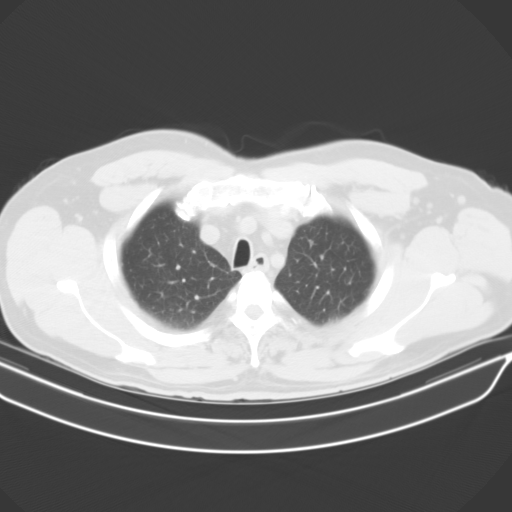
[im 57/62  lung]
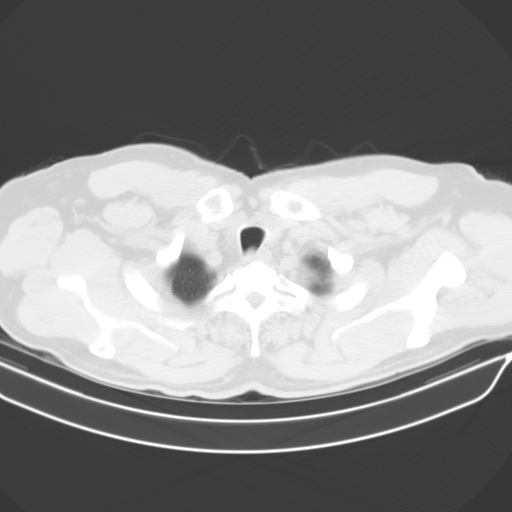

[Series 5: cor · coronal · 0.61mm/px · 3 of 76 slices shown]
[im 16/76  lung]
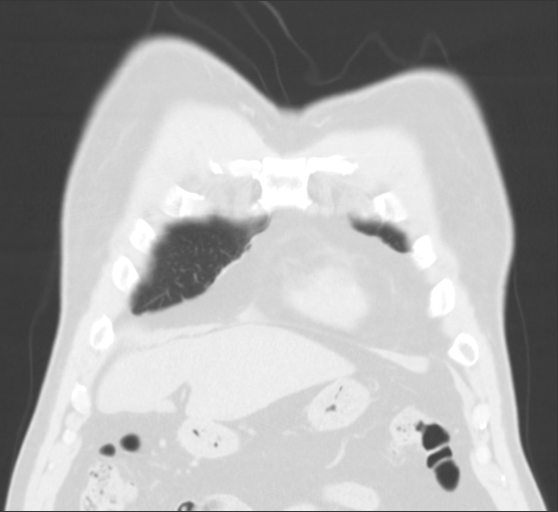
[im 31/76  lung]
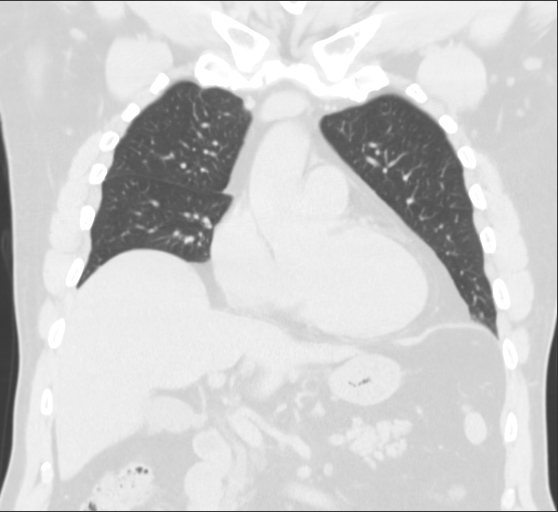
[im 46/76  lung]
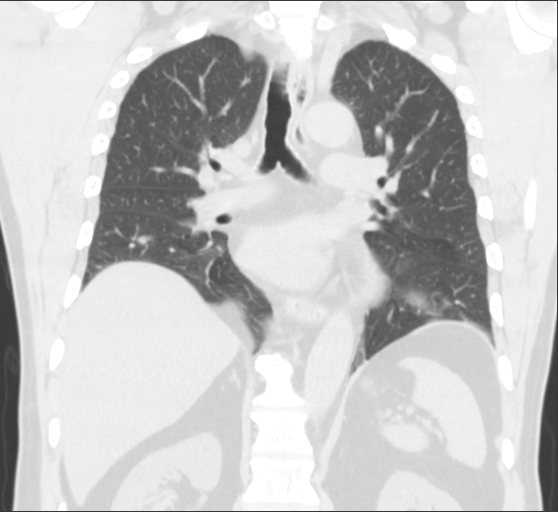

[15 of 36 positions shown; findings below may reference images not displayed]

FINDINGS: Mediastinum/Nodes: No significant hilar or mediastinal adenopathy.
Mild aortic and coronary calcification. Minimal pericardial
thickening. Mild cardiac enlargement.

Lungs/Pleura: 5 mm pulmonary nodule lateral right lower lobe image
New left lung is clear. No pleural effusion.

Upper abdomen: Unremarkable except for minimal hiatal hernia

Musculoskeletal: No acute findings
IMPRESSION: Two pulmonary nodules on the right measuring 4 and 5 mm
respectively. If the patient is at high risk for bronchogenic
carcinoma, follow-up chest CT at 6-12 months is recommended. If the
patient is at low risk for bronchogenic carcinoma, follow-up chest
CT at 12 months is recommended. This recommendation follows the
consensus statement: Guidelines for Management of Small Pulmonary
Nodules Detected on CT Scans: A Statement from the [employer]

## 2017-06-03 ENCOUNTER — Ambulatory Visit
Admission: RE | Admit: 2017-06-03 | Discharge: 2017-06-03 | Disposition: A | Discharge status: Home / Self Care | Payer: Medicare Other | Source: Ambulatory Visit | Attending: Internal Medicine | Admitting: Internal Medicine

## 2017-06-03 ENCOUNTER — Other Ambulatory Visit: Payer: Self-pay | Admitting: Internal Medicine

## 2017-06-03 DIAGNOSIS — R0602 Shortness of breath: Secondary | ICD-10-CM

## 2017-07-29 ENCOUNTER — Emergency Department (HOSPITAL_COMMUNITY)
Admission: EM | Admit: 2017-07-29 | Discharge: 2017-07-29 | Disposition: A | Discharge status: Home / Self Care | Payer: Medicare Other | Attending: Emergency Medicine | Admitting: Emergency Medicine

## 2017-07-29 ENCOUNTER — Emergency Department (HOSPITAL_COMMUNITY): Payer: Medicare Other

## 2017-07-29 DIAGNOSIS — S098XXA Other specified injuries of head, initial encounter: Secondary | ICD-10-CM | POA: Diagnosis not present

## 2017-07-29 DIAGNOSIS — E039 Hypothyroidism, unspecified: Secondary | ICD-10-CM | POA: Insufficient documentation

## 2017-07-29 DIAGNOSIS — R51 Headache: Secondary | ICD-10-CM | POA: Insufficient documentation

## 2017-07-29 DIAGNOSIS — Z87891 Personal history of nicotine dependence: Secondary | ICD-10-CM | POA: Insufficient documentation

## 2017-07-29 DIAGNOSIS — S01511A Laceration without foreign body of lip, initial encounter: Secondary | ICD-10-CM | POA: Diagnosis not present

## 2017-07-29 DIAGNOSIS — S0101XA Laceration without foreign body of scalp, initial encounter: Secondary | ICD-10-CM | POA: Diagnosis not present

## 2017-07-29 DIAGNOSIS — Y999 Unspecified external cause status: Secondary | ICD-10-CM | POA: Diagnosis not present

## 2017-07-29 DIAGNOSIS — I10 Essential (primary) hypertension: Secondary | ICD-10-CM | POA: Insufficient documentation

## 2017-07-29 DIAGNOSIS — Y939 Activity, unspecified: Secondary | ICD-10-CM | POA: Insufficient documentation

## 2017-07-29 DIAGNOSIS — S0083XA Contusion of other part of head, initial encounter: Secondary | ICD-10-CM

## 2017-07-29 DIAGNOSIS — Y929 Unspecified place or not applicable: Secondary | ICD-10-CM | POA: Insufficient documentation

## 2017-07-29 DIAGNOSIS — Z79899 Other long term (current) drug therapy: Secondary | ICD-10-CM | POA: Diagnosis not present

## 2017-07-29 DIAGNOSIS — S0990XA Unspecified injury of head, initial encounter: Secondary | ICD-10-CM

## 2017-07-29 LAB — CBC WITH DIFFERENTIAL/PLATELET
BASOS ABS: 0.1 10*3/uL (ref 0.0–0.1)
BASOS PCT: 0 %
Eosinophils Absolute: 0.2 10*3/uL (ref 0.0–0.7)
Eosinophils Relative: 1 %
HEMATOCRIT: 42.3 % (ref 39.0–52.0)
HEMOGLOBIN: 14 g/dL (ref 13.0–17.0)
LYMPHS PCT: 13 %
Lymphs Abs: 2.1 10*3/uL (ref 0.7–4.0)
MCH: 24.8 pg — ABNORMAL LOW (ref 26.0–34.0)
MCHC: 33.1 g/dL (ref 30.0–36.0)
MCV: 74.9 fL — AB (ref 78.0–100.0)
Monocytes Absolute: 0.9 10*3/uL (ref 0.1–1.0)
Monocytes Relative: 6 %
NEUTROS ABS: 12.9 10*3/uL — AB (ref 1.7–7.7)
NEUTROS PCT: 80 %
Platelets: 208 10*3/uL (ref 150–400)
RBC: 5.65 MIL/uL (ref 4.22–5.81)
RDW: 14.5 % (ref 11.5–15.5)
WBC: 16.1 10*3/uL — AB (ref 4.0–10.5)

## 2017-07-29 LAB — BASIC METABOLIC PANEL
Anion gap: 11 (ref 5–15)
BUN: 9 mg/dL (ref 6–20)
CALCIUM: 8.7 mg/dL — AB (ref 8.9–10.3)
CO2: 23 mmol/L (ref 22–32)
Chloride: 100 mmol/L — ABNORMAL LOW (ref 101–111)
Creatinine, Ser: 1.15 mg/dL (ref 0.61–1.24)
GFR calc non Af Amer: >60 mL/min (ref >60)
GLUCOSE: 194 mg/dL — AB (ref 65–99)
POTASSIUM: 3.5 mmol/L (ref 3.5–5.1)
Sodium: 134 mmol/L — ABNORMAL LOW (ref 135–145)

## 2017-07-29 LAB — PROTIME-INR
INR: 0.95
Prothrombin Time: 12.6 seconds (ref 11.4–15.2)

## 2017-07-29 MED ORDER — LIDOCAINE-EPINEPHRINE 2 %-1:200000 IJ SOLN
20.0000 mL | Freq: Once | INTRAMUSCULAR | Status: AC
Start: 2017-07-29 — End: 2017-07-29
  Administered 2017-07-29: 20 mL via INTRADERMAL
  Filled 2017-07-29: qty 20

## 2017-07-29 MED ORDER — ACETAMINOPHEN 500 MG PO TABS
1000.0000 mg | ORAL_TABLET | Freq: Once | ORAL | Status: AC
  Administered 2017-07-29: 1000 mg via ORAL
  Filled 2017-07-29: qty 2

## 2017-07-29 MED ORDER — BACITRACIN ZINC 500 UNIT/GM EX OINT
TOPICAL_OINTMENT | Freq: Once | CUTANEOUS | Status: AC
  Administered 2017-07-29: 2 via TOPICAL
  Filled 2017-07-29: qty 1.8

## 2017-07-29 NOTE — ED Notes (Addendum)
ED Provider at bedside.Wall-E in use. Interpreter Angie [phone]

## 2017-07-29 NOTE — ED Notes (Signed)
Bed: [facility] Expected date:  Expected time:  Means of arrival:  Comments: 

## 2017-07-29 NOTE — Discharge Instructions (Signed)
The cuts on your head will close on their own. Please do not shower or wet your head for 3 days. You may apply antibiotic ointment over a year cuts and dressed them with bandages. If you do shower the next 3 days, please do not wet your head and only shower from the neck down. The suture in your lip will dissolve on its own and does not require removal.  You may continue taking Tylenol for pain. Please refrain from using medications such as aspirin, Motrin, ibuprofen, Advil.
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
## 2017-09-22 ENCOUNTER — Encounter: Payer: Medicare Other | Attending: Internal Medicine | Admitting: *Deleted

## 2017-09-22 DIAGNOSIS — Z713 Dietary counseling and surveillance: Secondary | ICD-10-CM | POA: Diagnosis present

## 2017-09-22 DIAGNOSIS — E119 Type 2 diabetes mellitus without complications: Secondary | ICD-10-CM | POA: Diagnosis present

## 2017-09-28 NOTE — Patient Instructions (Signed)
Plan: You were instructed on use of Accu Chek Guide Meter that you brought to the appointment today. Consider checking once a day at various times of day to capture data both before and after meals. If all BG are within Target Ranges, consider testing twice a week or as often as your MD recommends.

## 2017-09-28 NOTE — Progress Notes (Signed)
Diabetes Self-Management Education  Visit Type: First/Initial  Appt. Start Time: 0845 Appt. End Time: 0945  09/28/2017  Mr. Alexander Murphy, identified by name and date of birth, is a 71 y.o. male with a diagnosis of Diabetes: Type 2. Patient late for 8 AM appointment but was able to see him today. He is here with his brother in law who interpreted for him and Family Interpretor Form signed Patient brought his new Accu Chek Meter to the appointment and requested to be trained on it's use. Patient states he is taking Metformin without any side effects.   ASSESSMENT  Height 5\' 3"  (1.6 m), weight 165 lb (74.8 kg). Body mass index is 29.23 kg/m.      Diabetes Self-Management Education - 09/22/17 0847      Visit Information   Visit Type First/Initial     Initial Visit   Diabetes Type Type 2   Are you currently following a meal plan? No   Are you taking your medications as prescribed? Yes     Psychosocial Assessment   Patient Belief/Attitude about Diabetes Motivated to manage diabetes   Self-care barriers Other (comment)  Language barrier   Self-management support Family   Other persons present Patient;Family Member  brother in law who also interpreted for the visit with pt permission   Patient Concerns Monitoring   Special Needs Simplified materials;Verbal instruction     Pre-Education Assessment   Patient understands the diabetes disease and treatment process. Needs Instruction   Patient understands incorporating nutritional management into lifestyle. Needs Instruction   Patient undertands incorporating physical activity into lifestyle. Needs Instruction   Patient understands using medications safely. Needs Instruction   Patient understands monitoring blood glucose, interpreting and using results Needs Instruction   Patient understands prevention, detection, and treatment of acute complications. Needs Instruction   Patient understands prevention, detection, and treatment of  chronic complications. Needs Instruction   Patient understands how to develop strategies to address psychosocial issues. Needs Instruction   Patient understands how to develop strategies to promote health/change behavior. Needs Instruction     Complications   Last HgB A1C per patient/outside source 5.9 %  history of 7.9 on 06/03/2017   How often do you check your blood sugar? 0 times/day (not testing)  wants instruction on meter today     Patient Education   Previous Diabetes Education No   Monitoring Taught/evaluated SMBG meter.;Identified appropriate SMBG and/or A1C goals.     Individualized Goals (developed by patient)   Monitoring  test blood glucose pre and post meals as discussed     Outcomes   Expected Outcomes Demonstrated limited interest in learning.  Expect minimal changes  Further diabetes education offered, patient declined   Future DMSE PRN   Program Status Not Completed      Individualized Plan for Diabetes Self-Management Training:   Learning Objective:  Patient will have a greater understanding of diabetes self-management. Patient education plan is to attend individual and/or group sessions per assessed needs and concerns.   Plan:   Patient Instructions  Plan: You were instructed on use of Accu Chek Guide Meter that you brought to the appointment today. Consider checking once a day at various times of day to capture data both before and after meals. If all BG are within Target Ranges, consider testing twice a week or as often as your MD recommends.    Expected Outcomes:  Demonstrated limited interest in learning.  Expect minimal changes (Further diabetes education offered, patient declined)  Education material provided: Food label handouts and Carbohydrate counting sheet both in [place]  If problems or questions, patient to contact team via:  Phone  Future DSME appointment: PRN

## 2020-07-23 ENCOUNTER — Ambulatory Visit (HOSPITAL_COMMUNITY)
Admission: EM | Admit: 2020-07-23 | Discharge: 2020-07-23 | Disposition: A | Discharge status: Home / Self Care | Payer: Medicare Other

## 2020-07-23 ENCOUNTER — Other Ambulatory Visit: Payer: Self-pay

## 2020-08-14 ENCOUNTER — Other Ambulatory Visit: Payer: Self-pay

## 2020-08-14 ENCOUNTER — Encounter (HOSPITAL_COMMUNITY): Payer: Self-pay | Admitting: Emergency Medicine

## 2020-08-14 ENCOUNTER — Ambulatory Visit (HOSPITAL_COMMUNITY)
Admission: EM | Admit: 2020-08-14 | Discharge: 2020-08-14 | Disposition: A | Discharge status: Home / Self Care | Payer: Medicare Other | Attending: Internal Medicine | Admitting: Internal Medicine

## 2020-08-14 ENCOUNTER — Ambulatory Visit (INDEPENDENT_AMBULATORY_CARE_PROVIDER_SITE_OTHER): Payer: Medicare Other

## 2020-08-14 DIAGNOSIS — R059 Cough, unspecified: Secondary | ICD-10-CM

## 2020-08-14 DIAGNOSIS — Z87891 Personal history of nicotine dependence: Secondary | ICD-10-CM | POA: Insufficient documentation

## 2020-08-14 DIAGNOSIS — Z7984 Long term (current) use of oral hypoglycemic drugs: Secondary | ICD-10-CM | POA: Diagnosis not present

## 2020-08-14 DIAGNOSIS — R05 Cough: Secondary | ICD-10-CM | POA: Insufficient documentation

## 2020-08-14 DIAGNOSIS — Z20822 Contact with and (suspected) exposure to covid-19: Secondary | ICD-10-CM | POA: Diagnosis not present

## 2020-08-14 DIAGNOSIS — N400 Enlarged prostate without lower urinary tract symptoms: Secondary | ICD-10-CM | POA: Diagnosis not present

## 2020-08-14 DIAGNOSIS — E039 Hypothyroidism, unspecified: Secondary | ICD-10-CM | POA: Diagnosis not present

## 2020-08-14 DIAGNOSIS — E119 Type 2 diabetes mellitus without complications: Secondary | ICD-10-CM | POA: Diagnosis not present

## 2020-08-14 DIAGNOSIS — Z79899 Other long term (current) drug therapy: Secondary | ICD-10-CM | POA: Insufficient documentation

## 2020-08-14 DIAGNOSIS — I10 Essential (primary) hypertension: Secondary | ICD-10-CM | POA: Insufficient documentation

## 2020-08-14 DIAGNOSIS — E7800 Pure hypercholesterolemia, unspecified: Secondary | ICD-10-CM | POA: Diagnosis not present

## 2020-08-14 LAB — SARS CORONAVIRUS 2 (TAT 6-24 HRS): SARS Coronavirus 2: NEGATIVE

## 2020-08-14 MED ORDER — BENZONATATE 100 MG PO CAPS: 100.0000 mg | ORAL_CAPSULE | Freq: Three times a day (TID) | ORAL | 0 refills | Status: AC | PRN

## 2020-08-14 NOTE — ED Provider Notes (Signed)
[place]    CSN: [id number] Arrival date & time: 08/14/20  5465      History   Chief Complaint Chief Complaint  Patient presents with  . Cough    HPI Alexander Murphy is a 74 y.o. male.   Alexander Murphy presents with complaints of cough. Started 2-3 weeks ago. Cough is dry. No shortness of breath . Pain with coughing, otherwise no chest pain . No fevers. No nasal drainage. Cough is worse at night. Cough is improved with sitting upright. No peripheral edema. He is retired. Quit smoking approximately 1 year ago. No history of asthma or COPD. Has tried mucinex for his cough, which does help some. He does take medication for htn, hypothyroidism and DM.   Son provides interpretation per patient request.    ROS per HPI, negative if not otherwise mentioned.      Past Medical History:  Diagnosis Date  . BPH (benign prostatic hyperplasia)   . Cardiomegaly   . High cholesterol   . Hypertension   . Hypothyroidism     Patient Active Problem List   Diagnosis Date Noted  . Hypertension   . Cardiomegaly     Past Surgical History:  Procedure Laterality Date  . COLONOSCOPY WITH PROPOFOL N/A 10/29/2015   Procedure: COLONOSCOPY WITH PROPOFOL;  Surgeon: Garlan Fair, MD;  Location: [facility];  Service: Endoscopy;  Laterality: N/A;  . INGUINAL HERNIA REPAIR Right 11/11/2015   Procedure: RIGHT INGUINAL HERNIA REPAIR WITH MESH;  Surgeon: Jackolyn Confer, MD;  Location: [facility];  Service: General;  Laterality: Right;       Home Medications    Prior to Admission medications   Medication Sig Start Date End Date Taking? Authorizing Provider  benzonatate (TESSALON) 100 MG capsule Take 1 capsule (100 mg total) by mouth every 8 (eight) hours as needed for cough. 08/14/20   Zigmund Gottron, NP  docusate sodium (COLACE) 100 MG capsule Take 100 mg by mouth daily.    [provider]  levothyroxine (SYNTHROID, LEVOTHROID) 50 MCG tablet Take 50 mcg by  mouth daily before breakfast.    [provider]  metFORMIN (GLUCOPHAGE) 500 MG tablet Take by mouth 2 (two) times daily with a meal.    [provider]  omeprazole (PRILOSEC) 20 MG capsule Take 20 mg by mouth daily.    [provider]  oxyCODONE (OXY IR/ROXICODONE) 5 MG immediate release tablet Take 1-2 tablets (5-10 mg total) by mouth every 4 (four) hours as needed for moderate pain, severe pain or breakthrough pain. 11/11/15   Jackolyn Confer, MD  pravastatin (PRAVACHOL) 20 MG tablet Take 20 mg by mouth daily.    [provider]  tamsulosin (FLOMAX) 0.4 MG CAPS capsule Take 0.4 mg by mouth every evening.    [provider]  triamcinolone cream (KENALOG) 0.1 % Apply 1 application topically daily.    [provider]    Family History Family History  Problem Relation Age of Onset  . Stroke Father     Social History Social History   Tobacco Use  . Smoking status: Former Smoker    Years: 10.00    Types: Cigarettes    Quit date: 03/21/2009    Years since quitting: 11.4  . Smokeless tobacco: Never Used  Substance Use Topics  . Alcohol use: Yes  . Drug use: No     Allergies   Patient has no known allergies.   Review of Systems Review of Systems  Physical Exam Triage Vital Signs ED Triage Vitals  Enc Vitals Group     BP 08/14/20 0939 134/69     Pulse Rate 08/14/20 0939 93     Resp 08/14/20 0939 18     Temp 08/14/20 0939 98.3 F (36.8 C)     Temp Source 08/14/20 0939 Oral     SpO2 08/14/20 0939 93 %     Weight --      Height --      Head Circumference --      Peak Flow --      Pain Score 08/14/20 0942 0     Pain Loc --      Pain Edu? --      Excl. in [place]? --    No data found.  Updated Vital Signs BP 134/69 (BP Location: Left Arm)   Pulse 93   Temp 98.3 F (36.8 C) (Oral)   Resp 18   SpO2 93%   Visual Acuity Right Eye Distance:   Left Eye Distance:   Bilateral Distance:    Right Eye Near:   Left  Eye Near:    Bilateral Near:     Physical Exam Constitutional:      Appearance: He is well-developed.  Cardiovascular:     Rate and Rhythm: Normal rate and regular rhythm.  Pulmonary:     Effort: Pulmonary effort is normal. No respiratory distress.     Breath sounds: Normal breath sounds. No wheezing.     Comments: No cough throughout exam  Musculoskeletal:     Right lower leg: No edema.     Left lower leg: No edema.  Skin:    General: Skin is warm and dry.  Neurological:     Mental Status: He is alert and oriented to person, place, and time.      UC Treatments / Results  Labs (all labs ordered are listed, but only abnormal results are displayed) Labs Reviewed  SARS CORONAVIRUS 2 (TAT 6-24 HRS)    EKG   Radiology DG Chest 2 View  Result Date: 08/14/2020 CLINICAL DATA:  Cough. EXAM: CHEST - 2 VIEW COMPARISON:  June 03, 2017. FINDINGS: The heart size and mediastinal contours are within normal limits. Both lungs are clear. The visualized skeletal structures are unremarkable. IMPRESSION: No active cardiopulmonary disease. Electronically Signed   By: Marijo Conception M.D.   On: 08/14/2020 10:23    Procedures Procedures (including critical care time)  Medications Ordered in UC Medications - No data to display  Initial Impression / Assessment and Plan / UC Course  I have reviewed the triage vital signs and the nursing notes.  Pertinent labs & imaging results that were available during my care of the patient were reviewed by me and considered in my medical decision making (see chart for details).     Cough for two weeks. No shortness of breath . Vitals stable although noted O2 of 93% on arrival. CXR without acute findings. No other URI symptoms. No edema. No chest pain . Supportive cares recommended. Encouraged close follow up with PCP for recheck. Return precautions provided. Patient and son verbalized understanding and agreeable to plan.   Final Clinical Impressions(s)  / UC Diagnoses   Final diagnoses:  Cough     Discharge Instructions     Your chest xray is normal today which is reassuring.  Push fluids to ensure adequate hydration and keep secretions thin.   Tessalon every 8 hours as needed for cough.  May  continue to use mucinex as needed for symptoms.  Self isolate until covid results are back and negative.  Will notify you by phone of any positive findings. Your negative results will be sent through your MyChart.     Please follow up with your Primary care provider if your symptoms persist as you may need further evaluation or referral.  Please return if worsening.          ED Prescriptions    Medication Sig Dispense Auth. Provider   benzonatate (TESSALON) 100 MG capsule Take 1 capsule (100 mg total) by mouth every 8 (eight) hours as needed for cough. 21 capsule Zigmund Gottron, NP     PDMP not reviewed this encounter.   Zigmund Gottron, NP 08/14/20 1056

## 2020-08-14 NOTE — ED Triage Notes (Signed)
Pt presents to [facility] for assessment of cough coming and going x 2-3 weeks.  Denies sOB, no lower extremity edema noted in triage.  Denies stuffy nose, denies abdominal pain

## 2020-08-14 NOTE — Discharge Instructions (Addendum)
Your chest xray is normal today which is reassuring.  Push fluids to ensure adequate hydration and keep secretions thin.   Tessalon every 8 hours as needed for cough.  May continue to use mucinex as needed for symptoms.  Self isolate until covid results are back and negative.  Will notify you by phone of any positive findings. Your negative results will be sent through your MyChart.     Please follow up with your Primary care provider if your symptoms persist as you may need further evaluation or referral.  Please return if worsening.

## 2021-01-13 DIAGNOSIS — E1169 Type 2 diabetes mellitus with other specified complication: Secondary | ICD-10-CM | POA: Diagnosis not present

## 2021-01-13 DIAGNOSIS — K219 Gastro-esophageal reflux disease without esophagitis: Secondary | ICD-10-CM | POA: Diagnosis not present

## 2021-01-13 DIAGNOSIS — E039 Hypothyroidism, unspecified: Secondary | ICD-10-CM | POA: Diagnosis not present

## 2021-01-13 DIAGNOSIS — E119 Type 2 diabetes mellitus without complications: Secondary | ICD-10-CM | POA: Diagnosis not present

## 2021-01-13 DIAGNOSIS — E1140 Type 2 diabetes mellitus with diabetic neuropathy, unspecified: Secondary | ICD-10-CM | POA: Diagnosis not present

## 2021-01-13 DIAGNOSIS — I10 Essential (primary) hypertension: Secondary | ICD-10-CM | POA: Diagnosis not present

## 2021-01-13 DIAGNOSIS — E7800 Pure hypercholesterolemia, unspecified: Secondary | ICD-10-CM | POA: Diagnosis not present

## 2021-02-11 DIAGNOSIS — E1140 Type 2 diabetes mellitus with diabetic neuropathy, unspecified: Secondary | ICD-10-CM | POA: Diagnosis not present

## 2021-02-11 DIAGNOSIS — K219 Gastro-esophageal reflux disease without esophagitis: Secondary | ICD-10-CM | POA: Diagnosis not present

## 2021-02-11 DIAGNOSIS — I10 Essential (primary) hypertension: Secondary | ICD-10-CM | POA: Diagnosis not present

## 2021-02-11 DIAGNOSIS — E119 Type 2 diabetes mellitus without complications: Secondary | ICD-10-CM | POA: Diagnosis not present

## 2021-02-11 DIAGNOSIS — E1169 Type 2 diabetes mellitus with other specified complication: Secondary | ICD-10-CM | POA: Diagnosis not present

## 2021-02-11 DIAGNOSIS — E7800 Pure hypercholesterolemia, unspecified: Secondary | ICD-10-CM | POA: Diagnosis not present

## 2021-02-11 DIAGNOSIS — E039 Hypothyroidism, unspecified: Secondary | ICD-10-CM | POA: Diagnosis not present

## 2021-03-19 DIAGNOSIS — I10 Essential (primary) hypertension: Secondary | ICD-10-CM | POA: Diagnosis not present

## 2021-03-19 DIAGNOSIS — E1169 Type 2 diabetes mellitus with other specified complication: Secondary | ICD-10-CM | POA: Diagnosis not present

## 2021-03-19 DIAGNOSIS — E1140 Type 2 diabetes mellitus with diabetic neuropathy, unspecified: Secondary | ICD-10-CM | POA: Diagnosis not present

## 2021-03-19 DIAGNOSIS — E039 Hypothyroidism, unspecified: Secondary | ICD-10-CM | POA: Diagnosis not present

## 2021-03-19 DIAGNOSIS — K219 Gastro-esophageal reflux disease without esophagitis: Secondary | ICD-10-CM | POA: Diagnosis not present

## 2021-03-19 DIAGNOSIS — E7800 Pure hypercholesterolemia, unspecified: Secondary | ICD-10-CM | POA: Diagnosis not present

## 2021-03-26 DIAGNOSIS — R059 Cough, unspecified: Secondary | ICD-10-CM | POA: Diagnosis not present

## 2021-03-26 DIAGNOSIS — E039 Hypothyroidism, unspecified: Secondary | ICD-10-CM | POA: Diagnosis not present

## 2021-03-26 DIAGNOSIS — I10 Essential (primary) hypertension: Secondary | ICD-10-CM | POA: Diagnosis not present

## 2021-03-26 DIAGNOSIS — Z23 Encounter for immunization: Secondary | ICD-10-CM | POA: Diagnosis not present

## 2021-03-26 DIAGNOSIS — E1140 Type 2 diabetes mellitus with diabetic neuropathy, unspecified: Secondary | ICD-10-CM | POA: Diagnosis not present

## 2021-03-26 DIAGNOSIS — Z0000 Encounter for general adult medical examination without abnormal findings: Secondary | ICD-10-CM | POA: Diagnosis not present

## 2021-03-26 DIAGNOSIS — K219 Gastro-esophageal reflux disease without esophagitis: Secondary | ICD-10-CM | POA: Diagnosis not present

## 2021-03-26 DIAGNOSIS — Z1389 Encounter for screening for other disorder: Secondary | ICD-10-CM | POA: Diagnosis not present

## 2021-03-26 DIAGNOSIS — E7800 Pure hypercholesterolemia, unspecified: Secondary | ICD-10-CM | POA: Diagnosis not present

## 2021-03-26 DIAGNOSIS — Z86010 Personal history of colonic polyps: Secondary | ICD-10-CM | POA: Diagnosis not present

## 2021-04-15 DIAGNOSIS — E1140 Type 2 diabetes mellitus with diabetic neuropathy, unspecified: Secondary | ICD-10-CM | POA: Diagnosis not present

## 2021-04-15 DIAGNOSIS — K219 Gastro-esophageal reflux disease without esophagitis: Secondary | ICD-10-CM | POA: Diagnosis not present

## 2021-04-15 DIAGNOSIS — E039 Hypothyroidism, unspecified: Secondary | ICD-10-CM | POA: Diagnosis not present

## 2021-04-15 DIAGNOSIS — E1169 Type 2 diabetes mellitus with other specified complication: Secondary | ICD-10-CM | POA: Diagnosis not present

## 2021-04-15 DIAGNOSIS — E7800 Pure hypercholesterolemia, unspecified: Secondary | ICD-10-CM | POA: Diagnosis not present

## 2021-04-15 DIAGNOSIS — I10 Essential (primary) hypertension: Secondary | ICD-10-CM | POA: Diagnosis not present

## 2021-05-09 DIAGNOSIS — E1140 Type 2 diabetes mellitus with diabetic neuropathy, unspecified: Secondary | ICD-10-CM | POA: Diagnosis not present

## 2021-05-09 DIAGNOSIS — E1169 Type 2 diabetes mellitus with other specified complication: Secondary | ICD-10-CM | POA: Diagnosis not present

## 2021-05-09 DIAGNOSIS — K219 Gastro-esophageal reflux disease without esophagitis: Secondary | ICD-10-CM | POA: Diagnosis not present

## 2021-05-09 DIAGNOSIS — I10 Essential (primary) hypertension: Secondary | ICD-10-CM | POA: Diagnosis not present

## 2021-05-09 DIAGNOSIS — E7800 Pure hypercholesterolemia, unspecified: Secondary | ICD-10-CM | POA: Diagnosis not present

## 2021-05-09 DIAGNOSIS — E039 Hypothyroidism, unspecified: Secondary | ICD-10-CM | POA: Diagnosis not present

## 2021-06-11 DIAGNOSIS — E1169 Type 2 diabetes mellitus with other specified complication: Secondary | ICD-10-CM | POA: Diagnosis not present

## 2021-06-11 DIAGNOSIS — E7800 Pure hypercholesterolemia, unspecified: Secondary | ICD-10-CM | POA: Diagnosis not present

## 2021-06-11 DIAGNOSIS — E039 Hypothyroidism, unspecified: Secondary | ICD-10-CM | POA: Diagnosis not present

## 2021-06-11 DIAGNOSIS — I10 Essential (primary) hypertension: Secondary | ICD-10-CM | POA: Diagnosis not present

## 2021-06-11 DIAGNOSIS — K219 Gastro-esophageal reflux disease without esophagitis: Secondary | ICD-10-CM | POA: Diagnosis not present

## 2021-06-11 DIAGNOSIS — E1140 Type 2 diabetes mellitus with diabetic neuropathy, unspecified: Secondary | ICD-10-CM | POA: Diagnosis not present

## 2021-07-10 DIAGNOSIS — E039 Hypothyroidism, unspecified: Secondary | ICD-10-CM | POA: Diagnosis not present

## 2021-07-10 DIAGNOSIS — K219 Gastro-esophageal reflux disease without esophagitis: Secondary | ICD-10-CM | POA: Diagnosis not present

## 2021-07-10 DIAGNOSIS — E7800 Pure hypercholesterolemia, unspecified: Secondary | ICD-10-CM | POA: Diagnosis not present

## 2021-07-10 DIAGNOSIS — E1169 Type 2 diabetes mellitus with other specified complication: Secondary | ICD-10-CM | POA: Diagnosis not present

## 2021-07-10 DIAGNOSIS — I10 Essential (primary) hypertension: Secondary | ICD-10-CM | POA: Diagnosis not present

## 2021-07-10 DIAGNOSIS — E1140 Type 2 diabetes mellitus with diabetic neuropathy, unspecified: Secondary | ICD-10-CM | POA: Diagnosis not present

## 2021-08-08 DIAGNOSIS — E039 Hypothyroidism, unspecified: Secondary | ICD-10-CM | POA: Diagnosis not present

## 2021-08-08 DIAGNOSIS — K219 Gastro-esophageal reflux disease without esophagitis: Secondary | ICD-10-CM | POA: Diagnosis not present

## 2021-08-08 DIAGNOSIS — E1169 Type 2 diabetes mellitus with other specified complication: Secondary | ICD-10-CM | POA: Diagnosis not present

## 2021-08-08 DIAGNOSIS — E7800 Pure hypercholesterolemia, unspecified: Secondary | ICD-10-CM | POA: Diagnosis not present

## 2021-08-08 DIAGNOSIS — E1140 Type 2 diabetes mellitus with diabetic neuropathy, unspecified: Secondary | ICD-10-CM | POA: Diagnosis not present

## 2021-08-08 DIAGNOSIS — I10 Essential (primary) hypertension: Secondary | ICD-10-CM | POA: Diagnosis not present

## 2021-09-08 DIAGNOSIS — E039 Hypothyroidism, unspecified: Secondary | ICD-10-CM | POA: Diagnosis not present

## 2021-09-08 DIAGNOSIS — E7800 Pure hypercholesterolemia, unspecified: Secondary | ICD-10-CM | POA: Diagnosis not present

## 2021-09-08 DIAGNOSIS — E1140 Type 2 diabetes mellitus with diabetic neuropathy, unspecified: Secondary | ICD-10-CM | POA: Diagnosis not present

## 2021-09-08 DIAGNOSIS — K219 Gastro-esophageal reflux disease without esophagitis: Secondary | ICD-10-CM | POA: Diagnosis not present

## 2021-09-08 DIAGNOSIS — I10 Essential (primary) hypertension: Secondary | ICD-10-CM | POA: Diagnosis not present

## 2021-09-08 DIAGNOSIS — E1169 Type 2 diabetes mellitus with other specified complication: Secondary | ICD-10-CM | POA: Diagnosis not present

## 2021-09-29 DIAGNOSIS — Z23 Encounter for immunization: Secondary | ICD-10-CM | POA: Diagnosis not present

## 2021-09-29 DIAGNOSIS — R21 Rash and other nonspecific skin eruption: Secondary | ICD-10-CM | POA: Diagnosis not present

## 2021-09-29 DIAGNOSIS — E7800 Pure hypercholesterolemia, unspecified: Secondary | ICD-10-CM | POA: Diagnosis not present

## 2021-09-29 DIAGNOSIS — E1140 Type 2 diabetes mellitus with diabetic neuropathy, unspecified: Secondary | ICD-10-CM | POA: Diagnosis not present

## 2021-09-29 DIAGNOSIS — I10 Essential (primary) hypertension: Secondary | ICD-10-CM | POA: Diagnosis not present

## 2021-09-29 DIAGNOSIS — E039 Hypothyroidism, unspecified: Secondary | ICD-10-CM | POA: Diagnosis not present

## 2021-10-07 DIAGNOSIS — E1140 Type 2 diabetes mellitus with diabetic neuropathy, unspecified: Secondary | ICD-10-CM | POA: Diagnosis not present

## 2021-10-07 DIAGNOSIS — E1169 Type 2 diabetes mellitus with other specified complication: Secondary | ICD-10-CM | POA: Diagnosis not present

## 2021-10-07 DIAGNOSIS — E039 Hypothyroidism, unspecified: Secondary | ICD-10-CM | POA: Diagnosis not present

## 2021-10-07 DIAGNOSIS — K219 Gastro-esophageal reflux disease without esophagitis: Secondary | ICD-10-CM | POA: Diagnosis not present

## 2021-10-07 DIAGNOSIS — E7800 Pure hypercholesterolemia, unspecified: Secondary | ICD-10-CM | POA: Diagnosis not present

## 2021-10-07 DIAGNOSIS — I10 Essential (primary) hypertension: Secondary | ICD-10-CM | POA: Diagnosis not present

## 2021-10-20 DIAGNOSIS — Z23 Encounter for immunization: Secondary | ICD-10-CM | POA: Diagnosis not present

## 2021-11-06 DIAGNOSIS — I10 Essential (primary) hypertension: Secondary | ICD-10-CM | POA: Diagnosis not present

## 2021-11-06 DIAGNOSIS — K219 Gastro-esophageal reflux disease without esophagitis: Secondary | ICD-10-CM | POA: Diagnosis not present

## 2021-11-06 DIAGNOSIS — E1169 Type 2 diabetes mellitus with other specified complication: Secondary | ICD-10-CM | POA: Diagnosis not present

## 2021-11-06 DIAGNOSIS — E1140 Type 2 diabetes mellitus with diabetic neuropathy, unspecified: Secondary | ICD-10-CM | POA: Diagnosis not present

## 2021-11-06 DIAGNOSIS — E039 Hypothyroidism, unspecified: Secondary | ICD-10-CM | POA: Diagnosis not present

## 2021-11-06 DIAGNOSIS — E7800 Pure hypercholesterolemia, unspecified: Secondary | ICD-10-CM | POA: Diagnosis not present

## 2021-12-01 DIAGNOSIS — E039 Hypothyroidism, unspecified: Secondary | ICD-10-CM | POA: Diagnosis not present

## 2021-12-09 DIAGNOSIS — E1140 Type 2 diabetes mellitus with diabetic neuropathy, unspecified: Secondary | ICD-10-CM | POA: Diagnosis not present

## 2021-12-09 DIAGNOSIS — E039 Hypothyroidism, unspecified: Secondary | ICD-10-CM | POA: Diagnosis not present

## 2021-12-09 DIAGNOSIS — K219 Gastro-esophageal reflux disease without esophagitis: Secondary | ICD-10-CM | POA: Diagnosis not present

## 2021-12-09 DIAGNOSIS — E7800 Pure hypercholesterolemia, unspecified: Secondary | ICD-10-CM | POA: Diagnosis not present

## 2021-12-09 DIAGNOSIS — E1169 Type 2 diabetes mellitus with other specified complication: Secondary | ICD-10-CM | POA: Diagnosis not present

## 2021-12-09 DIAGNOSIS — I10 Essential (primary) hypertension: Secondary | ICD-10-CM | POA: Diagnosis not present

## 2022-01-05 DIAGNOSIS — K219 Gastro-esophageal reflux disease without esophagitis: Secondary | ICD-10-CM | POA: Diagnosis not present

## 2022-01-05 DIAGNOSIS — E1169 Type 2 diabetes mellitus with other specified complication: Secondary | ICD-10-CM | POA: Diagnosis not present

## 2022-01-05 DIAGNOSIS — I10 Essential (primary) hypertension: Secondary | ICD-10-CM | POA: Diagnosis not present

## 2022-01-05 DIAGNOSIS — E039 Hypothyroidism, unspecified: Secondary | ICD-10-CM | POA: Diagnosis not present

## 2022-01-05 DIAGNOSIS — E1140 Type 2 diabetes mellitus with diabetic neuropathy, unspecified: Secondary | ICD-10-CM | POA: Diagnosis not present

## 2022-01-05 DIAGNOSIS — E7800 Pure hypercholesterolemia, unspecified: Secondary | ICD-10-CM | POA: Diagnosis not present

## 2022-02-03 DIAGNOSIS — E1169 Type 2 diabetes mellitus with other specified complication: Secondary | ICD-10-CM | POA: Diagnosis not present

## 2022-02-03 DIAGNOSIS — E7800 Pure hypercholesterolemia, unspecified: Secondary | ICD-10-CM | POA: Diagnosis not present

## 2022-03-09 DIAGNOSIS — I10 Essential (primary) hypertension: Secondary | ICD-10-CM | POA: Diagnosis not present

## 2022-03-09 DIAGNOSIS — E1140 Type 2 diabetes mellitus with diabetic neuropathy, unspecified: Secondary | ICD-10-CM | POA: Diagnosis not present

## 2022-03-30 DIAGNOSIS — E7800 Pure hypercholesterolemia, unspecified: Secondary | ICD-10-CM | POA: Diagnosis not present

## 2022-03-30 DIAGNOSIS — Z1389 Encounter for screening for other disorder: Secondary | ICD-10-CM | POA: Diagnosis not present

## 2022-03-30 DIAGNOSIS — Z0000 Encounter for general adult medical examination without abnormal findings: Secondary | ICD-10-CM | POA: Diagnosis not present

## 2022-03-30 DIAGNOSIS — L409 Psoriasis, unspecified: Secondary | ICD-10-CM | POA: Diagnosis not present

## 2022-03-30 DIAGNOSIS — I10 Essential (primary) hypertension: Secondary | ICD-10-CM | POA: Diagnosis not present

## 2022-03-30 DIAGNOSIS — E1140 Type 2 diabetes mellitus with diabetic neuropathy, unspecified: Secondary | ICD-10-CM | POA: Diagnosis not present

## 2022-03-30 DIAGNOSIS — E039 Hypothyroidism, unspecified: Secondary | ICD-10-CM | POA: Diagnosis not present

## 2022-04-03 DIAGNOSIS — E7800 Pure hypercholesterolemia, unspecified: Secondary | ICD-10-CM | POA: Diagnosis not present

## 2022-04-03 DIAGNOSIS — E1169 Type 2 diabetes mellitus with other specified complication: Secondary | ICD-10-CM | POA: Diagnosis not present

## 2022-06-12 DIAGNOSIS — E7800 Pure hypercholesterolemia, unspecified: Secondary | ICD-10-CM | POA: Diagnosis not present

## 2022-06-12 DIAGNOSIS — I10 Essential (primary) hypertension: Secondary | ICD-10-CM | POA: Diagnosis not present

## 2022-06-12 DIAGNOSIS — E1169 Type 2 diabetes mellitus with other specified complication: Secondary | ICD-10-CM | POA: Diagnosis not present

## 2022-08-03 DIAGNOSIS — K219 Gastro-esophageal reflux disease without esophagitis: Secondary | ICD-10-CM | POA: Diagnosis not present

## 2022-08-03 DIAGNOSIS — E7800 Pure hypercholesterolemia, unspecified: Secondary | ICD-10-CM | POA: Diagnosis not present

## 2022-08-03 DIAGNOSIS — I10 Essential (primary) hypertension: Secondary | ICD-10-CM | POA: Diagnosis not present

## 2022-08-03 DIAGNOSIS — E039 Hypothyroidism, unspecified: Secondary | ICD-10-CM | POA: Diagnosis not present

## 2022-08-03 DIAGNOSIS — E1169 Type 2 diabetes mellitus with other specified complication: Secondary | ICD-10-CM | POA: Diagnosis not present

## 2022-09-01 DIAGNOSIS — E039 Hypothyroidism, unspecified: Secondary | ICD-10-CM | POA: Diagnosis not present

## 2022-09-01 DIAGNOSIS — E7800 Pure hypercholesterolemia, unspecified: Secondary | ICD-10-CM | POA: Diagnosis not present

## 2022-09-01 DIAGNOSIS — E1169 Type 2 diabetes mellitus with other specified complication: Secondary | ICD-10-CM | POA: Diagnosis not present

## 2022-09-01 DIAGNOSIS — K219 Gastro-esophageal reflux disease without esophagitis: Secondary | ICD-10-CM | POA: Diagnosis not present

## 2022-09-01 DIAGNOSIS — I10 Essential (primary) hypertension: Secondary | ICD-10-CM | POA: Diagnosis not present

## 2022-09-29 DIAGNOSIS — K5900 Constipation, unspecified: Secondary | ICD-10-CM | POA: Diagnosis not present

## 2022-09-29 DIAGNOSIS — E1140 Type 2 diabetes mellitus with diabetic neuropathy, unspecified: Secondary | ICD-10-CM | POA: Diagnosis not present

## 2022-09-29 DIAGNOSIS — E7800 Pure hypercholesterolemia, unspecified: Secondary | ICD-10-CM | POA: Diagnosis not present

## 2022-09-29 DIAGNOSIS — I10 Essential (primary) hypertension: Secondary | ICD-10-CM | POA: Diagnosis not present

## 2022-09-29 DIAGNOSIS — E039 Hypothyroidism, unspecified: Secondary | ICD-10-CM | POA: Diagnosis not present

## 2022-09-29 DIAGNOSIS — R519 Headache, unspecified: Secondary | ICD-10-CM | POA: Diagnosis not present

## 2022-10-01 DIAGNOSIS — E7800 Pure hypercholesterolemia, unspecified: Secondary | ICD-10-CM | POA: Diagnosis not present

## 2022-10-01 DIAGNOSIS — I10 Essential (primary) hypertension: Secondary | ICD-10-CM | POA: Diagnosis not present

## 2022-10-01 DIAGNOSIS — E039 Hypothyroidism, unspecified: Secondary | ICD-10-CM | POA: Diagnosis not present

## 2022-10-01 DIAGNOSIS — E1169 Type 2 diabetes mellitus with other specified complication: Secondary | ICD-10-CM | POA: Diagnosis not present

## 2022-11-02 DIAGNOSIS — E7800 Pure hypercholesterolemia, unspecified: Secondary | ICD-10-CM | POA: Diagnosis not present

## 2022-11-02 DIAGNOSIS — E1169 Type 2 diabetes mellitus with other specified complication: Secondary | ICD-10-CM | POA: Diagnosis not present

## 2022-11-02 DIAGNOSIS — E039 Hypothyroidism, unspecified: Secondary | ICD-10-CM | POA: Diagnosis not present

## 2022-11-02 DIAGNOSIS — I10 Essential (primary) hypertension: Secondary | ICD-10-CM | POA: Diagnosis not present

## 2022-11-03 DIAGNOSIS — K5900 Constipation, unspecified: Secondary | ICD-10-CM | POA: Diagnosis not present

## 2022-11-03 DIAGNOSIS — I10 Essential (primary) hypertension: Secondary | ICD-10-CM | POA: Diagnosis not present

## 2022-11-30 DIAGNOSIS — I10 Essential (primary) hypertension: Secondary | ICD-10-CM | POA: Diagnosis not present

## 2022-11-30 DIAGNOSIS — E7800 Pure hypercholesterolemia, unspecified: Secondary | ICD-10-CM | POA: Diagnosis not present

## 2022-11-30 DIAGNOSIS — E039 Hypothyroidism, unspecified: Secondary | ICD-10-CM | POA: Diagnosis not present

## 2022-11-30 DIAGNOSIS — E1169 Type 2 diabetes mellitus with other specified complication: Secondary | ICD-10-CM | POA: Diagnosis not present

## 2022-12-30 DIAGNOSIS — I10 Essential (primary) hypertension: Secondary | ICD-10-CM | POA: Diagnosis not present

## 2022-12-30 DIAGNOSIS — E1169 Type 2 diabetes mellitus with other specified complication: Secondary | ICD-10-CM | POA: Diagnosis not present

## 2022-12-30 DIAGNOSIS — E7800 Pure hypercholesterolemia, unspecified: Secondary | ICD-10-CM | POA: Diagnosis not present

## 2022-12-30 DIAGNOSIS — E039 Hypothyroidism, unspecified: Secondary | ICD-10-CM | POA: Diagnosis not present

## 2023-01-28 DIAGNOSIS — I10 Essential (primary) hypertension: Secondary | ICD-10-CM | POA: Diagnosis not present

## 2023-01-28 DIAGNOSIS — E7800 Pure hypercholesterolemia, unspecified: Secondary | ICD-10-CM | POA: Diagnosis not present

## 2023-01-28 DIAGNOSIS — E039 Hypothyroidism, unspecified: Secondary | ICD-10-CM | POA: Diagnosis not present

## 2023-01-28 DIAGNOSIS — E1169 Type 2 diabetes mellitus with other specified complication: Secondary | ICD-10-CM | POA: Diagnosis not present

## 2023-04-01 DIAGNOSIS — E039 Hypothyroidism, unspecified: Secondary | ICD-10-CM | POA: Diagnosis not present

## 2023-04-01 DIAGNOSIS — I10 Essential (primary) hypertension: Secondary | ICD-10-CM | POA: Diagnosis not present

## 2023-04-01 DIAGNOSIS — E1142 Type 2 diabetes mellitus with diabetic polyneuropathy: Secondary | ICD-10-CM | POA: Diagnosis not present

## 2023-04-01 DIAGNOSIS — R059 Cough, unspecified: Secondary | ICD-10-CM | POA: Diagnosis not present

## 2023-04-01 DIAGNOSIS — L853 Xerosis cutis: Secondary | ICD-10-CM | POA: Diagnosis not present

## 2023-04-01 DIAGNOSIS — K219 Gastro-esophageal reflux disease without esophagitis: Secondary | ICD-10-CM | POA: Diagnosis not present

## 2023-04-01 DIAGNOSIS — E7800 Pure hypercholesterolemia, unspecified: Secondary | ICD-10-CM | POA: Diagnosis not present

## 2023-04-01 DIAGNOSIS — Z0000 Encounter for general adult medical examination without abnormal findings: Secondary | ICD-10-CM | POA: Diagnosis not present

## 2023-04-01 DIAGNOSIS — E1140 Type 2 diabetes mellitus with diabetic neuropathy, unspecified: Secondary | ICD-10-CM | POA: Diagnosis not present

## 2023-10-01 DIAGNOSIS — E119 Type 2 diabetes mellitus without complications: Secondary | ICD-10-CM | POA: Diagnosis not present

## 2023-10-01 DIAGNOSIS — E1142 Type 2 diabetes mellitus with diabetic polyneuropathy: Secondary | ICD-10-CM | POA: Diagnosis not present

## 2023-10-01 DIAGNOSIS — I10 Essential (primary) hypertension: Secondary | ICD-10-CM | POA: Diagnosis not present

## 2023-10-01 DIAGNOSIS — Z23 Encounter for immunization: Secondary | ICD-10-CM | POA: Diagnosis not present

## 2023-10-01 DIAGNOSIS — E7800 Pure hypercholesterolemia, unspecified: Secondary | ICD-10-CM | POA: Diagnosis not present

## 2023-10-01 DIAGNOSIS — L853 Xerosis cutis: Secondary | ICD-10-CM | POA: Diagnosis not present

## 2023-10-01 DIAGNOSIS — E1140 Type 2 diabetes mellitus with diabetic neuropathy, unspecified: Secondary | ICD-10-CM | POA: Diagnosis not present

## 2023-10-01 DIAGNOSIS — E039 Hypothyroidism, unspecified: Secondary | ICD-10-CM | POA: Diagnosis not present

## 2024-10-02 NOTE — Progress Notes (Addendum)
 Alexander Murphy                                          MRN: [id number]   11/29/2024   The [facility] Quality Team Specialist reviewed this patient medical record for the purposes of chart review for care gap closure. The following were reviewed: chart review for care gap closure-kidney health evaluation for diabetes:eGFR  and uACR.    [facility] Quality Team
